# Patient Record
Sex: Female | Born: 1975 | Race: White | Hispanic: No | Marital: Married | Smoking: Never smoker
Health system: Southern US, Community
[De-identification: ages and names within clinical notes are randomized; demographics above are authoritative.]

## PROBLEM LIST (undated history)

## (undated) DIAGNOSIS — N289 Disorder of kidney and ureter, unspecified: Secondary | ICD-10-CM

## (undated) DIAGNOSIS — M549 Dorsalgia, unspecified: Secondary | ICD-10-CM

## (undated) DIAGNOSIS — M2550 Pain in unspecified joint: Secondary | ICD-10-CM

## (undated) DIAGNOSIS — R1310 Dysphagia, unspecified: Secondary | ICD-10-CM

## (undated) HISTORY — DX: Dorsalgia, unspecified: M54.9

## (undated) HISTORY — DX: Disorder of kidney and ureter, unspecified: N28.9

## (undated) HISTORY — DX: Dysphagia, unspecified: R13.10

## (undated) HISTORY — DX: Pain in unspecified joint: M25.50

---

## 2008-05-28 ENCOUNTER — Inpatient Hospital Stay (HOSPITAL_COMMUNITY): Admission: AD | Admit: 2008-05-28 | Discharge: 2008-05-28 | Payer: Self-pay | Admitting: Obstetrics and Gynecology

## 2008-07-06 ENCOUNTER — Inpatient Hospital Stay (HOSPITAL_COMMUNITY): Admission: RE | Admit: 2008-07-06 | Discharge: 2008-07-08 | Payer: Self-pay | Admitting: Obstetrics and Gynecology

## 2010-11-28 NOTE — Op Note (Signed)
[name], [name]           ACCOUNT NO.:  [id number]   MEDICAL RECORD NO.:  [id number]          PATIENT TYPE:  INP   LOCATION:  9124                          FACILITY:  [facility]   PHYSICIAN:  Maxie Better, M.D.DATE OF BIRTH:  1976-04-25   DATE OF PROCEDURE:  07/07/2008  DATE OF DISCHARGE:                               OPERATIVE REPORT   PREOPERATIVE DIAGNOSIS:  Previous cesarean section, term gestation.   PROCEDURE:  Repeat cesarean section, Kerr hysterotomy.   POSTOPERATIVE DIAGNOSIS:  Previous cesarean section, term gestation.   ANESTHESIA:  Spinal.   SURGEON:  Maxie Better, MD   ASSISTANT:  Marlinda Mike, CNM   INDICATIONS:  A 35 year old gravida 2, para 1 female at term with a  previous cesarean section scheduled for a repeat C-section.  Prenatal  course had been uncomplicated.  Consent signed.  The patient was  transferred to the operating room.   PROCEDURE:  Under adequate spinal anesthesia, the patient was placed in  a supine position with a left lateral tilt.  She was sterilely prepped  and draped in usual fashion.  An indwelling Foley catheter was sterilely  placed.  Marcaine 0.25% was injected along the previous Pfannenstiel  skin incision.  Pfannenstiel skin incision was then made, carried down  to the rectus fascia.  Rectus fascia was opened transversely.  The  rectus fascia was then bluntly and with cautery dissected off the rectus  muscle in superior and inferior fashion.  The rectus muscles were split  in midline.  The parietal peritoneum was entered sharply and extended.  Small amount of adhesions of the bladder was noted in the lower uterine  segment.  The vesicouterine peritoneum was carefully opened  transversely.  The bladder was dissected off the lower uterine segment,  some of which was adherent and therefore was not able to be fully  displaced inferiorly.  Nonetheless, thin lower uterine segment was  encountered.  Curvilinear low transverse  uterine incision was then made  and extended with bandage scissors.  Subsequent delivery of a live female  from a transverse position was then accomplished.  Baby was bulb  suctioned in the abdomen.  The cord was clamped, cut.  The baby was  transferred to the awaiting pediatrician who assigned Apgars of 9 and 9  at one and five minutes.  The placenta, which was anterior was manually  removed.  Uterine cavity was cleaned of debris.  Uterine incision had no  extension.  The first layer was closed with 0-Monocryl running locked  stitch.  Additional removal of the bladder off the lower uterine segment  was then performed in order to facilitate a second layer of closure with  0-Monocryl in an imbricated fashion.  Good hemostasis was then noted.  Normal tubes and ovaries were noted bilaterally.  The abdomen was  copiously irrigated and suctioned of debris.  The parietal peritoneum  was closed with 2-0 Vicryl.  The rectus fascia was closed with 0-Vicryl  x2.  The subcutaneous area was irrigated, small bleeders cauterized.  Interrupted 2-0 plain sutures placed and the skin approximated using  Ethicon staples.   SPECIMENS:  Placenta not sent to Pathology.   ESTIMATED BLOOD LOSS:  600 mL.   URINE OUTPUT:  150 mL of clear yellow urine.   INTRAOPERATIVE FLUID:  1250 mL.   Sponge and instrument counts x2 was correct.   COMPLICATIONS:  None.   Weight of the baby was 9 pounds 3 ounces.  The patient tolerated the  procedure well and was transferred to recovery room in stable condition.      Maxie Better, M.D.  Electronically Signed     [place]/MEDQ  D:  07/06/2008  T:  07/06/2008  Job:  [id number]

## 2010-12-01 NOTE — Discharge Summary (Signed)
[name], [name]           ACCOUNT NO.:  [id number]   MEDICAL RECORD NO.:  [id number]          PATIENT TYPE:  INP   LOCATION:  9124                          FACILITY:  [facility]   PHYSICIAN:  Maxie Better, M.D.DATE OF BIRTH:  [birth year]   DATE OF ADMISSION:  07/06/2008  DATE OF DISCHARGE:  07/08/2008                               DISCHARGE SUMMARY   ADMISSION DIAGNOSIS:  Previous cesarean section, term gestation.   DISCHARGE DIAGNOSES:  Term gestation, delivered, previous cesarean  section, upper respiratory infection.   HISTORY OF PRESENT ILLNESS:  A 35 year old gravida 2, para 1-0-0-1  female with previous cesarean section, now at term, desires repeat C-  section.   HOSPITAL COURSE:  The patient was admitted to [facility].  She was  taken to the operating room where she underwent a repeat cesarean  section procedure resulted in delivery of a live female,  9 pounds 3  ounces, normal tubes and ovaries, Apgars of 9 and 9.  The lower uterine  segment was very thin at the time of her surgery.  Her postoperative  course was complicated by complaints of green productive sputum with  cough not responsive to Tessalon or Robitussin.  She was afebrile.  Her  lungs were clear.  Her incision had no evidence of infection.  Her CBC  on postop day #1 showed a hemoglobin of 11, hematocrit of 32.3, white  count was 8.9, platelet count of 254,000.  On postop day #2, the patient  requested to go home.   DISPOSITION:  Home.   CONDITION:  Stable.   DISCHARGE MEDICATIONS:  1. Z-Pak as directed.  2. Tylox 1-2 tablets every 3-4 hours p.r.n. pain.  3. Motrin 800 mg one p.o. q.6-8 h. p.r.n. pain.  4. Tussionex 1 teaspoon p.o. b.i.d.   DISCHARGE INSTRUCTIONS:  Per the postpartum booklet given.   FOLLOWUP:  Followup appointment at [facility] in 6 weeks.      Maxie Better, M.D.  Electronically Signed     [place]/MEDQ  D:  08/01/2008  T:  08/01/2008  Job:  [id number]

## 2011-04-20 LAB — CBC
HCT: 35.7 % — ABNORMAL LOW (ref 36.0–46.0)
Hemoglobin: 11.7 g/dL — ABNORMAL LOW (ref 12.0–15.0)
MCHC: 34.2 g/dL (ref 30.0–36.0)
MCV: 82.6 fL (ref 78.0–100.0)
MCV: 82.8 fL (ref 78.0–100.0)
RBC: 3.9 MIL/uL (ref 3.87–5.11)
RDW: 14.8 % (ref 11.5–15.5)
RDW: 14.8 % (ref 11.5–15.5)

## 2011-04-20 LAB — CCBB MATERNAL DONOR DRAW

## 2018-08-28 ENCOUNTER — Encounter (INDEPENDENT_AMBULATORY_CARE_PROVIDER_SITE_OTHER): Payer: Self-pay

## 2018-09-08 ENCOUNTER — Ambulatory Visit (INDEPENDENT_AMBULATORY_CARE_PROVIDER_SITE_OTHER): Payer: BLUE CROSS/BLUE SHIELD | Admitting: Family Medicine

## 2018-09-08 ENCOUNTER — Encounter (INDEPENDENT_AMBULATORY_CARE_PROVIDER_SITE_OTHER): Payer: Self-pay | Admitting: Family Medicine

## 2018-09-08 VITALS — BP 115/80 | HR 76 | Temp 98.3°F | Ht 65.0 in | Wt 232.0 lb

## 2018-09-08 DIAGNOSIS — R5383 Other fatigue: Secondary | ICD-10-CM

## 2018-09-08 DIAGNOSIS — Z6838 Body mass index (BMI) 38.0-38.9, adult: Secondary | ICD-10-CM | POA: Diagnosis not present

## 2018-09-08 DIAGNOSIS — R0602 Shortness of breath: Secondary | ICD-10-CM

## 2018-09-08 DIAGNOSIS — Z9189 Other specified personal risk factors, not elsewhere classified: Secondary | ICD-10-CM

## 2018-09-08 DIAGNOSIS — Z0289 Encounter for other administrative examinations: Secondary | ICD-10-CM

## 2018-09-08 DIAGNOSIS — Z1331 Encounter for screening for depression: Secondary | ICD-10-CM | POA: Diagnosis not present

## 2018-09-08 DIAGNOSIS — K219 Gastro-esophageal reflux disease without esophagitis: Secondary | ICD-10-CM | POA: Diagnosis not present

## 2018-09-08 DIAGNOSIS — E559 Vitamin D deficiency, unspecified: Secondary | ICD-10-CM

## 2018-09-08 NOTE — Progress Notes (Signed)
Office: [phone]  /  Fax: [phone]   Dear Dr. Cherly Hensen,   Thank you for referring CADEN FATICA to our clinic. The following note includes my evaluation and treatment recommendations.  HPI:   Chief Complaint: OBESITY    [name] has been referred by Maxie Better, MD for consultation regarding her obesity and obesity related comorbidities.    [name] (MR# [id number]) is a 43 y.o. female who presents on 09/08/2018 for obesity evaluation and treatment. Current BMI is Body mass index is 38.61 kg/m.[name] [name] has been struggling with her weight for many years and has been unsuccessful in either losing weight, maintaining weight loss, or reaching her healthy weight goal.     [name] attended our information session and states she is currently in the action stage of change and ready to dedicate time achieving and maintaining a healthier weight. [name] is interested in becoming our patient and working on intensive lifestyle modifications including (but not limited to) diet, exercise and weight loss.    [name] states her family eats meals together she thinks her family will eat healthier with  her her desired weight loss is 62 lbs she has been heavy most of  her life she started gaining weight in 2004 her heaviest weight ever was 232 lbs. she is a picky eater and doesn't like to eat healthier foods  she snacks frequently in the evenings she is frequently drinking liquids with calories (Coke Zero 3 12-oz. cans a day) she frequently makes poor food choices she frequently eats larger portions than normal  she struggles with emotional eating    Fatigue [name] feels her energy is lower than it should be. This has worsened with weight gain and has worsened recently. [name] admits to daytime somnolence and  admits to waking up still tired. Patient is at risk for obstructive sleep apnea. Patent has a history of symptoms of daytime fatigue, morning fatigue  and morning headache. Patient generally gets 6 hours of sleep per night, and states they generally have generally restful sleep. Snoring is present. Apneic episodes are not present. Epworth Sleepiness Score is 14.  Dyspnea on exertion [name] notes increasing shortness of breath with exercising and seems to be worsening over time with weight gain. She notes getting out of breath sooner with activity than she used to. This has gotten worse recently. [name] denies orthopnea.   Gastroesophageal Reflux Disease (GERD) [name] states that her GERD symptoms do not occur on a daily basis.   Vitamin D deficiency [name] has a diagnosis of Vitamin D deficiency likely given her obesity.  At risk for osteopenia and osteoporosis [name] is at higher risk of osteopenia and osteoporosis due to Vitamin D deficiency.   Depression Screen [name]'s Food and Mood (modified PHQ-9) score was 10.  Depression screen [facility] 2/9 09/08/2018  Decreased Interest 2  Down, Depressed, Hopeless 1  PHQ - 2 Score 3  Altered sleeping 0  Tired, decreased energy 3  Change in appetite 2  Feeling bad or failure about yourself  1  Trouble concentrating 1  Moving slowly or fidgety/restless 0  Suicidal thoughts 0  PHQ-9 Score 10  Difficult doing work/chores Not difficult at all   ASSESSMENT AND PLAN:  Other fatigue - Plan: EKG 12-Lead, CBC With Differential, Comprehensive metabolic panel, Hemoglobin A1c, Insulin, random, T3, T4, free, TSH, Vitamin B12, Folate  Shortness of breath on exertion - Plan: Lipid Panel With LDL/HDL Ratio  Gastroesophageal reflux disease, esophagitis presence not specified  Vitamin  D deficiency - Plan: VITAMIN D 25 Hydroxy (Vit-D Deficiency, Fractures)  Depression screening  At risk for osteoporosis  Class 2 severe obesity with serious comorbidity and body mass index (BMI) of 38.0 to 38.9 in adult, unspecified obesity type (HCC)  PLAN:  Fatigue [name] was informed that her fatigue may  be related to obesity, depression or many other causes. Labs will be ordered, and in the meanwhile [name] has agreed to work on diet, exercise and weight loss to help with fatigue. Proper sleep hygiene was discussed including the need for 7-8 hours of quality sleep each night. A sleep study was not ordered based on symptoms and Epworth score. Will obtain EKG, IC, and labs on today's visit.  Dyspnea on exertion [name]'s shortness of breath appears to be obesity related and exercise induced. She has agreed to work on weight loss and gradually increase exercise to treat her exercise induced shortness of breath. If [name] follows our instructions and loses weight without improvement of her shortness of breath, we will plan to refer to pulmonology. We will monitor this condition regularly. [name] agrees to this plan.  Gastroesophageal Reflux Disease (GERD) We will follow-up at her next appointment in 2 weeks.  Vitamin D Deficiency [name] was informed that low Vitamin D levels contributes to fatigue and are associated with obesity, breast, and colon cancer. We will obtain Vitamin D level today. [name] agrees to follow-up with our clinic in 2 weeks.  At risk for osteopenia and osteoporosis [name] was given extended  (15 minutes) osteoporosis prevention counseling today. [name] is at risk for osteopenia and osteoporsis due to her Vvitamin D deficiency. She was encouraged to take her Vitamin D and follow her higher calcium diet and increase strengthening exercise to help strengthen her bones and decrease her risk of osteopenia and osteoporosis.  Depression Screen [name] had a moderately positive depression screening. Depression is commonly associated with obesity and often results in emotional eating behaviors. We will monitor this closely and work on CBT to help improve the non-hunger eating patterns. Referral to Psychology may be required if no improvement is seen as she continues in our  clinic.  Obesity [name] is currently in the action stage of change and her goal is to continue with weight loss efforts. I recommend Lilyrose begin the structured treatment plan as follows:  She has agreed to follow the Category 3 plan. She was advised to limit sweet tea to 2 days a week and may substitute english muffin for bread at breakfast. Shonya has been instructed to eventually work up to a goal of 150 minutes of combined cardio and strengthening exercise per week for weight loss and overall health benefits. We discussed the following Behavioral Modification Strategies today: increasing lean protein intake, increasing vegetables, work on meal planning and easy cooking plan, and planning for success.   She was informed of the importance of frequent follow up visits to maximize her success with intensive lifestyle modifications for her multiple health conditions. She was informed we would discuss her lab results at her next visit unless there is a critical issue that needs to be addressed sooner. Bari agreed to keep her next visit at the agreed upon time to discuss these results.  ALLERGIES: Allergies  Allergen Reactions  . Sulfa Antibiotics Rash    MEDICATIONS: Current Outpatient Medications on File Prior to Visit  Medication Sig Dispense Refill  . Cetirizine HCl (ZYRTEC ALLERGY) 10 MG CAPS Take 1 capsule by mouth daily.    . Cranberry  1000 MG CAPS Take 1 capsule by mouth daily.    [name] ibuprofen (ADVIL,MOTRIN) 100 MG tablet Take 100 mg by mouth every 6 (six) hours as needed for fever.    [name] Oil 1000 MG CAPS Take 1 capsule by mouth daily.    [name] levonorgestrel-ethinyl estradiol (AVIANE) 0.1-20 MG-MCG tablet Take 1 tablet by mouth daily.    . Probiotic Product (PROBIOTIC-10 PO) Take 1 capsule by mouth daily.    . pseudoephedrine (SUDAFED) 30 MG tablet Take 30 mg by mouth every 4 (four) hours as needed for congestion.     No current facility-administered medications on file prior  to visit.     PAST MEDICAL HISTORY: Past Medical History:  Diagnosis Date  . Back pain   . Joint pain   . Kidney problem   . Swallowing difficulty     PAST SURGICAL HISTORY: Past Surgical History:  Procedure Laterality Date  . CESAREAN SECTION     x2    SOCIAL HISTORY: Social History   Tobacco Use  . Smoking status: Never Smoker  . Smokeless tobacco: Never Used  Substance Use Topics  . Alcohol use: Not Currently  . Drug use: Never   FAMILY HISTORY: History reviewed. No pertinent family history.  ROS: Review of Systems  Constitutional: Positive for malaise/fatigue.  HENT: Positive for sinus pain.        Positive for difficult or painful swallowing.  Eyes:       Positive for floaters.  Cardiovascular: Negative for orthopnea.  Gastrointestinal: Positive for heartburn.       Positive for swallowing difficulty.  Musculoskeletal: Positive for back pain, joint pain and myalgias.  Neurological:       Positive for leg cramping.  Endo/Heme/Allergies: Bruises/bleeds easily.   PHYSICAL EXAM: Blood pressure 115/80, pulse 76, temperature 98.3 F (36.8 C), temperature source Oral, height 5\' 5"  (1.651 m), weight 232 lb (105.2 kg), SpO2 97 %. Body mass index is 38.61 kg/m. Physical Exam Vitals signs reviewed.  Constitutional:      Appearance: Normal appearance. She is well-developed. She is obese.  HENT:     Head: Normocephalic and atraumatic.     Nose: Nose normal.  Eyes:     General: No scleral icterus. Neck:     Musculoskeletal: Normal range of motion.  Cardiovascular:     Rate and Rhythm: Normal rate and regular rhythm.  Pulmonary:     Effort: Pulmonary effort is normal. No respiratory distress.  Abdominal:     Palpations: Abdomen is soft.     Tenderness: There is no abdominal tenderness.  Musculoskeletal: Normal range of motion.     Comments: Range of motion normal in all four extremities.  Skin:    General: Skin is warm and dry.  Neurological:      Mental Status: She is alert and oriented to person, place, and time.     Coordination: Coordination normal.  Psychiatric:        Mood and Affect: Mood and affect normal.        Behavior: Behavior normal.   RECENT LABS AND TESTS: BMET No results found for: NA, K, CL, CO2, GLUCOSE, BUN, CREATININE, CALCIUM, GFRNONAA, GFRAA No results found for: HGBA1C No results found for: INSULIN CBC    Component Value Date/Time   WBC 8.9 07/07/2008 0505   RBC 3.90 07/07/2008 0505   HGB 11.0 (L) 07/07/2008 0505   HCT 32.3 (L) 07/07/2008 0505   PLT 254 07/07/2008 0505   MCV 82.8 07/07/2008  0505   MCHC 34.2 07/07/2008 0505   RDW 14.8 07/07/2008 0505   Iron/TIBC/Ferritin/ %Sat No results found for: IRON, TIBC, FERRITIN, IRONPCTSAT Lipid Panel  No results found for: CHOL, TRIG, HDL, CHOLHDL, VLDL, LDLCALC, LDLDIRECT Hepatic Function Panel  No results found for: PROT, ALBUMIN, AST, ALT, ALKPHOS, BILITOT, BILIDIR, IBILI No results found for: TSH  ECG  shows low voltage in precordial leads - decreasing R-wave progression, may be secondary to pulmonary disease. Consider old anterior infarct. Rate of 82 BPM.  INDIRECT CALORIMETER done today shows a VO2 of 300 and a REE of 2085.  Her calculated basal metabolic rate is 9798 thus her basal metabolic rate is better than expected.  OBESITY BEHAVIORAL INTERVENTION VISIT  Today's visit was #1   Starting weight: 232 lbs Starting date: 09/08/2018 Today's weight: 232 lbs Today's date: 09/08/2018 Total lbs lost to date: 0   09/08/2018  Height 5\' 5"  (1.651 m)  Weight 232 lb (105.2 kg)  BMI (Calculated) 38.61  BLOOD PRESSURE - SYSTOLIC 115  BLOOD PRESSURE - DIASTOLIC 80  Waist Measurement  46 inches   Body Fat % 44.8 %  Total Body Water (lbs) 86.6 lbs  RMR 2085   ASK: We discussed the diagnosis of obesity with [name] today and Ogechi agreed to give [place] permission to discuss obesity behavioral modification therapy  today.  ASSESS: Zenae has the diagnosis of obesity and her BMI today is 38.61. Jaryah is in the action stage of change.   ADVISE: Xochilt was educated on the multiple health risks of obesity as well as the benefit of weight loss to improve her health. She was advised of the need for long term treatment and the importance of lifestyle modifications to improve her current health and to decrease her risk of future health problems.  AGREE: Multiple dietary modification options and treatment options were discussed and  Kessa agreed to follow the recommendations documented in the above note.  ARRANGE: Soniah was educated on the importance of frequent visits to treat obesity as outlined per CMS and USPSTF guidelines and agreed to schedule her next follow up appointment today.  I, Marianna Payment, am acting as transcriptionist for Debbra Riding, MD   I have reviewed the above documentation for accuracy and completeness, and I agree with the above. - Debbra Riding, MD

## 2018-09-09 LAB — COMPREHENSIVE METABOLIC PANEL
A/G RATIO: 1.4 (ref 1.2–2.2)
ALT: 16 IU/L (ref 0–32)
AST: 14 IU/L (ref 0–40)
Albumin: 4.2 g/dL (ref 3.8–4.8)
Alkaline Phosphatase: 90 IU/L (ref 39–117)
BUN/Creatinine Ratio: 14 (ref 9–23)
BUN: 10 mg/dL (ref 6–24)
Bilirubin Total: <0.2 mg/dL (ref 0.0–1.2)
CALCIUM: 9.2 mg/dL (ref 8.7–10.2)
CO2: 22 mmol/L (ref 20–29)
CREATININE: 0.69 mg/dL (ref 0.57–1.00)
Chloride: 106 mmol/L (ref 96–106)
GFR, EST AFRICAN AMERICAN: 125 mL/min/{1.73_m2} (ref >59)
GFR, EST NON AFRICAN AMERICAN: 108 mL/min/{1.73_m2} (ref >59)
GLUCOSE: 86 mg/dL (ref 65–99)
Globulin, Total: 3 g/dL (ref 1.5–4.5)
Potassium: 4.8 mmol/L (ref 3.5–5.2)
Sodium: 146 mmol/L — ABNORMAL HIGH (ref 134–144)
TOTAL PROTEIN: 7.2 g/dL (ref 6.0–8.5)

## 2018-09-09 LAB — LIPID PANEL WITH LDL/HDL RATIO
CHOLESTEROL TOTAL: 223 mg/dL — AB (ref 100–199)
HDL: 77 mg/dL (ref >39)
LDL Calculated: 130 mg/dL — ABNORMAL HIGH (ref 0–99)
LDl/HDL Ratio: 1.7 ratio (ref 0.0–3.2)
TRIGLYCERIDES: 82 mg/dL (ref 0–149)
VLDL Cholesterol Cal: 16 mg/dL (ref 5–40)

## 2018-09-09 LAB — HEMOGLOBIN A1C
Est. average glucose Bld gHb Est-mCnc: 105 mg/dL
Hgb A1c MFr Bld: 5.3 % (ref 4.8–5.6)

## 2018-09-09 LAB — FOLATE: FOLATE: 3 ng/mL — AB (ref >3.0)

## 2018-09-09 LAB — CBC WITH DIFFERENTIAL
Basophils Absolute: 0 10*3/uL (ref 0.0–0.2)
Basos: 0 %
EOS (ABSOLUTE): 0.1 10*3/uL (ref 0.0–0.4)
Eos: 1 %
Hematocrit: 40.6 % (ref 34.0–46.6)
Hemoglobin: 13.4 g/dL (ref 11.1–15.9)
IMMATURE GRANS (ABS): 0 10*3/uL (ref 0.0–0.1)
Immature Granulocytes: 0 %
LYMPHS: 20 %
Lymphocytes Absolute: 2 10*3/uL (ref 0.7–3.1)
MCH: 28.5 pg (ref 26.6–33.0)
MCHC: 33 g/dL (ref 31.5–35.7)
MCV: 86 fL (ref 79–97)
Monocytes Absolute: 0.6 10*3/uL (ref 0.1–0.9)
Monocytes: 6 %
NEUTROS PCT: 73 %
Neutrophils Absolute: 7.3 10*3/uL — ABNORMAL HIGH (ref 1.4–7.0)
RBC: 4.71 x10E6/uL (ref 3.77–5.28)
RDW: 12.1 % (ref 11.7–15.4)
WBC: 10 10*3/uL (ref 3.4–10.8)

## 2018-09-09 LAB — INSULIN, RANDOM: INSULIN: 18.7 u[IU]/mL (ref 2.6–24.9)

## 2018-09-09 LAB — T4, FREE: Free T4: 0.99 ng/dL (ref 0.82–1.77)

## 2018-09-09 LAB — TSH: TSH: 0.64 u[IU]/mL (ref 0.450–4.500)

## 2018-09-09 LAB — T3: T3, Total: 161 ng/dL (ref 71–180)

## 2018-09-09 LAB — VITAMIN B12: VITAMIN B 12: 229 pg/mL — AB (ref 232–1245)

## 2018-09-09 LAB — VITAMIN D 25 HYDROXY (VIT D DEFICIENCY, FRACTURES): Vit D, 25-Hydroxy: 35.9 ng/mL (ref 30.0–100.0)

## 2018-09-22 ENCOUNTER — Encounter (INDEPENDENT_AMBULATORY_CARE_PROVIDER_SITE_OTHER): Payer: Self-pay | Admitting: Family Medicine

## 2018-09-22 ENCOUNTER — Ambulatory Visit (INDEPENDENT_AMBULATORY_CARE_PROVIDER_SITE_OTHER): Payer: BLUE CROSS/BLUE SHIELD | Admitting: Family Medicine

## 2018-09-22 VITALS — BP 117/78 | HR 86 | Temp 97.9°F | Ht 65.0 in | Wt 224.0 lb

## 2018-09-22 DIAGNOSIS — E8881 Metabolic syndrome: Secondary | ICD-10-CM | POA: Diagnosis not present

## 2018-09-22 DIAGNOSIS — Z9189 Other specified personal risk factors, not elsewhere classified: Secondary | ICD-10-CM

## 2018-09-22 DIAGNOSIS — Z6837 Body mass index (BMI) 37.0-37.9, adult: Secondary | ICD-10-CM

## 2018-09-22 DIAGNOSIS — E538 Deficiency of other specified B group vitamins: Secondary | ICD-10-CM

## 2018-09-22 DIAGNOSIS — E559 Vitamin D deficiency, unspecified: Secondary | ICD-10-CM

## 2018-09-22 MED ORDER — VITAMIN D (ERGOCALCIFEROL) 1.25 MG (50000 UNIT) PO CAPS: 50000.0000 [IU] | ORAL_CAPSULE | ORAL | 0 refills | Status: DC

## 2018-09-22 NOTE — Progress Notes (Signed)
Office: [phone]  /  Fax: [phone]   HPI:   Chief Complaint: OBESITY [name] is here to discuss her progress with her obesity treatment plan. She is on the Category 3 plan and is following her eating plan approximately 80 % of the time. She states she is exercising 0 minutes 0 times per week. [name] found following the meal plan to be relatively easy. She found dinner to be challenging secondary to effort of making food. She notes minimal hunger during the 1st week. She had snacks of rice cakes mini, Kind bars, and Yasso bars.  Her weight is 224 lb (101.6 kg) today and has had a weight loss of 8 pounds over a period of 2 weeks since her last visit. She has lost 8 lbs since starting treatment with [place].  Vitamin D Deficiency [name] has a diagnosis of vitamin D deficiency. She is not currently taking OTC Vit D. She notes fatigue and denies nausea, vomiting or muscle weakness.  At risk for osteopenia and osteoporosis [name] is at higher risk of osteopenia and osteoporosis due to vitamin D deficiency.   Vitamin B12 Deficiency [name] has a diagnosis of B12 insufficiency and notes fatigue. She is not on OTC supplementation. Her Vitamin B12 level is 229. She is not a vegetarian and does not have a previous diagnosis of pernicious anemia. She does not have a history of weight loss surgery.   Folate Deficiency [name] has a diagnosis of folate deficiency. Her folate level is of 3.0.  Insulin Resistance [name] has a diagnosis of insulin resistance based on her elevated fasting insulin level >5. Insulin of 18.7 and Hgb A1c of 5.3. Although [name]'s blood glucose readings are still under good control, insulin resistance puts her at greater risk of metabolic syndrome and diabetes. She is not taking metformin currently and notes occasional cravings for sweets. She continues to work on diet and exercise to decrease risk of diabetes.  ASSESSMENT AND PLAN:  Vitamin D deficiency - Plan:  Vitamin D, Ergocalciferol, (DRISDOL) 1.25 MG (50000 UT) CAPS capsule  B12 nutritional deficiency  Folate deficiency  Insulin resistance  At risk for osteoporosis  Class 2 severe obesity with serious comorbidity and body mass index (BMI) of 37.0 to 37.9 in adult, unspecified obesity type (HCC)  PLAN:  Vitamin D Deficiency [name] was informed that low vitamin D levels contributes to fatigue and are associated with obesity, breast, and colon cancer. [name] agrees to start prescription Vit D @50 ,000 IU every week #4 with no refills. She will follow up for routine testing of vitamin D, at least 2-3 times per year. She was informed of the risk of over-replacement of vitamin D and agrees to not increase her dose unless she discusses this with [place] first. [name] agrees to follow up with our clinic in 2 weeks.  At risk for osteopenia and osteoporosis [name] was given extended (30 minutes) osteoporosis prevention counseling today. [name] is at risk for osteopenia and osteoporsis due to her vitamin D deficiency. She was encouraged to take her vitamin D and follow her higher calcium diet and increase strengthening exercise to help strengthen her bones and decrease her risk of osteopenia and osteoporosis.  Vitamin B12 Deficiency [name] will work on increasing B12 rich foods in her diet. B12 supplementation was not prescribed today. We will repeat Vit B12 level in 3 months. She was encouraged to start daily multivitamins. [name] agrees to follow up with our clinic in 2 weeks.  Folate Deficiency We will repeat folate  in 3 months. She was encouraged to take multivitamins. [name] agrees to follow up with our clinic in 2 weeks.  Insulin Resistance [name] will continue to work on weight loss, exercise, and decreasing simple carbohydrates in her diet to help decrease the risk of diabetes. We dicussed metformin including benefits and risks. She was informed that eating too many simple carbohydrates or  too many calories at one sitting increases the likelihood of GI side effects. [name] declined metformin for now and prescription was not written today. We will repeat insulin and Hgb A1c in 3 months. [name] agrees to follow up with our clinic in 2 weeks as directed to monitor her progress.  Obesity [name] is currently in the action stage of change. As such, her goal is to continue with weight loss efforts She has agreed to follow the Category 3 plan [name] has been instructed to work up to a goal of 150 minutes of combined cardio and strengthening exercise per week for weight loss and overall health benefits. We discussed the following Behavioral Modification Strategies today: increasing lean protein intake, increasing vegetables, work on meal planning and easy cooking plans, better snacking choices, and planning for success   [name] has agreed to follow up with our clinic in 2 weeks. She was informed of the importance of frequent follow up visits to maximize her success with intensive lifestyle modifications for her multiple health conditions.  ALLERGIES: Allergies  Allergen Reactions  . Sulfa Antibiotics Rash    MEDICATIONS: Current Outpatient Medications on File Prior to Visit  Medication Sig Dispense Refill  . Cetirizine HCl (ZYRTEC ALLERGY) 10 MG CAPS Take 1 capsule by mouth daily.    . Cranberry 1000 MG CAPS Take 1 capsule by mouth daily.    [name] ibuprofen (ADVIL,MOTRIN) 100 MG tablet Take 100 mg by mouth every 6 (six) hours as needed for fever.    [name] Oil 1000 MG CAPS Take 1 capsule by mouth daily.    [name] levonorgestrel-ethinyl estradiol (AVIANE) 0.1-20 MG-MCG tablet Take 1 tablet by mouth daily.    . Probiotic Product (PROBIOTIC-10 PO) Take 1 capsule by mouth daily.    . pseudoephedrine (SUDAFED) 30 MG tablet Take 30 mg by mouth every 4 (four) hours as needed for congestion.     No current facility-administered medications on file prior to visit.     PAST MEDICAL  HISTORY: Past Medical History:  Diagnosis Date  . Back pain   . Joint pain   . Kidney problem   . Swallowing difficulty     PAST SURGICAL HISTORY: Past Surgical History:  Procedure Laterality Date  . CESAREAN SECTION     x2    SOCIAL HISTORY: Social History   Tobacco Use  . Smoking status: Never Smoker  . Smokeless tobacco: Never Used  Substance Use Topics  . Alcohol use: Not Currently  . Drug use: Never    FAMILY HISTORY: History reviewed. No pertinent family history.  ROS: Review of Systems  Constitutional: Positive for malaise/fatigue and weight loss.  Gastrointestinal: Negative for nausea and vomiting.  Musculoskeletal:       Negative muscle weakness    PHYSICAL EXAM: Blood pressure 117/78, pulse 86, temperature 97.9 F (36.6 C), temperature source Oral, height 5\' 5"  (1.651 m), weight 224 lb (101.6 kg), SpO2 99 %. Body mass index is 37.28 kg/m. Physical Exam Vitals signs reviewed.  Constitutional:      Appearance: Normal appearance. She is obese.  Cardiovascular:     Rate and Rhythm:  Normal rate.     Pulses: Normal pulses.  Pulmonary:     Effort: Pulmonary effort is normal.     Breath sounds: Normal breath sounds.  Musculoskeletal: Normal range of motion.  Skin:    General: Skin is warm and dry.  Neurological:     Mental Status: She is alert and oriented to person, place, and time.  Psychiatric:        Mood and Affect: Mood normal.        Behavior: Behavior normal.     RECENT LABS AND TESTS: BMET    Component Value Date/Time   NA 146 (H) 09/08/2018 1207   K 4.8 09/08/2018 1207   CL 106 09/08/2018 1207   CO2 22 09/08/2018 1207   GLUCOSE 86 09/08/2018 1207   BUN 10 09/08/2018 1207   CREATININE 0.69 09/08/2018 1207   CALCIUM 9.2 09/08/2018 1207   GFRNONAA 108 09/08/2018 1207   GFRAA 125 09/08/2018 1207   Lab Results  Component Value Date   HGBA1C 5.3 09/08/2018   Lab Results  Component Value Date   INSULIN 18.7 09/08/2018   CBC     Component Value Date/Time   WBC 10.0 09/08/2018 1207   WBC 8.9 07/07/2008 0505   RBC 4.71 09/08/2018 1207   RBC 3.90 07/07/2008 0505   HGB 13.4 09/08/2018 1207   HCT 40.6 09/08/2018 1207   PLT 254 07/07/2008 0505   MCV 86 09/08/2018 1207   MCH 28.5 09/08/2018 1207   MCHC 33.0 09/08/2018 1207   MCHC 34.2 07/07/2008 0505   RDW 12.1 09/08/2018 1207   LYMPHSABS 2.0 09/08/2018 1207   EOSABS 0.1 09/08/2018 1207   BASOSABS 0.0 09/08/2018 1207   Iron/TIBC/Ferritin/ %Sat No results found for: IRON, TIBC, FERRITIN, IRONPCTSAT Lipid Panel     Component Value Date/Time   CHOL 223 (H) 09/08/2018 1207   TRIG 82 09/08/2018 1207   HDL 77 09/08/2018 1207   LDLCALC 130 (H) 09/08/2018 1207   Hepatic Function Panel     Component Value Date/Time   PROT 7.2 09/08/2018 1207   ALBUMIN 4.2 09/08/2018 1207   AST 14 09/08/2018 1207   ALT 16 09/08/2018 1207   ALKPHOS 90 09/08/2018 1207   BILITOT <0.2 09/08/2018 1207      Component Value Date/Time   TSH 0.640 09/08/2018 1207      OBESITY BEHAVIORAL INTERVENTION VISIT  Today's visit was # 2   Starting weight: 232 lbs Starting date: 09/08/2018 Today's weight : 224 lbs  Today's date: 09/22/2018 Total lbs lost to date: 8    09/22/2018  Height  (1.651 m)  Weight 224 lb (101.6 kg)  BMI (Calculated) 37.28  BLOOD PRESSURE - SYSTOLIC 117  BLOOD PRESSURE - DIASTOLIC 78   Body Fat % 43.5 %  Total Body Water (lbs) 84.2 lbs     ASK: We discussed the diagnosis of obesity with [name] today and [name] agreed to give [place] permission to discuss obesity behavioral modification therapy today.  ASSESS: Kinnley has the diagnosis of obesity and her BMI today is 37.28 [name] is in the action stage of change   ADVISE: [name] was educated on the multiple health risks of obesity as well as the benefit of weight loss to improve her health. She was advised of the need for long term treatment and the importance of lifestyle  modifications to improve her current health and to decrease her risk of future health problems.  AGREE: Multiple dietary modification options and treatment  options were discussed and  [name] agreed to follow the recommendations documented in the above note.  ARRANGE: [name] was educated on the importance of frequent visits to treat obesity as outlined per CMS and USPSTF guidelines and agreed to schedule her next follow up appointment today.  I, Burt Knack, am acting as transcriptionist for Debbra Riding, MD  I have reviewed the above documentation for accuracy and completeness, and I agree with the above. - Debbra Riding, MD

## 2018-10-07 ENCOUNTER — Encounter (INDEPENDENT_AMBULATORY_CARE_PROVIDER_SITE_OTHER): Payer: Self-pay

## 2018-10-09 ENCOUNTER — Encounter (INDEPENDENT_AMBULATORY_CARE_PROVIDER_SITE_OTHER): Payer: Self-pay

## 2018-10-09 ENCOUNTER — Encounter (INDEPENDENT_AMBULATORY_CARE_PROVIDER_SITE_OTHER): Payer: Self-pay | Admitting: Family Medicine

## 2018-10-13 ENCOUNTER — Encounter (INDEPENDENT_AMBULATORY_CARE_PROVIDER_SITE_OTHER): Payer: Self-pay | Admitting: Family Medicine

## 2018-10-13 ENCOUNTER — Other Ambulatory Visit: Payer: Self-pay

## 2018-10-13 ENCOUNTER — Ambulatory Visit (INDEPENDENT_AMBULATORY_CARE_PROVIDER_SITE_OTHER): Payer: BLUE CROSS/BLUE SHIELD | Admitting: Family Medicine

## 2018-10-13 DIAGNOSIS — Z6837 Body mass index (BMI) 37.0-37.9, adult: Secondary | ICD-10-CM | POA: Diagnosis not present

## 2018-10-13 DIAGNOSIS — J309 Allergic rhinitis, unspecified: Secondary | ICD-10-CM

## 2018-10-13 DIAGNOSIS — E559 Vitamin D deficiency, unspecified: Secondary | ICD-10-CM | POA: Diagnosis not present

## 2018-10-13 NOTE — Progress Notes (Signed)
Office: [phone]  /  Fax: [phone] TeleHealth Visit:  [name] has consented to this TeleHealth visit today via telephone call on facetime. The patient is located at home, the provider is located at the [facility] office. The participants in this visit include the listed provider and patient and provider's assistant.   HPI:   Chief Complaint: OBESITY [name] is here to discuss her progress with her obesity treatment plan. She is on the Category 3 plan and is following her eating plan approximately 75 % of the time. She states she is exercising 0 minutes 0 times per week. [name] has found the structure of the meal plan to be helpful in home schooling. [place] the [place] and indulged in pound cake. She found following the plan to be relatively easy. She denies hunger. She is using Yasso bars for snacks.  We were unable to weight the patient today for this TeleHealth visit. She feels as if she has lost 7 lbs since her last visit. She has lost 8-15 lbs since starting treatment with [place].  Vitamin D Deficiency [name] has a diagnosis of vitamin D deficiency. She is currently taking prescription Vit D. She notes fatigue and denies nausea, vomiting or muscle weakness.  Allergic Rhinitis [name] notes her symptoms are controlled. No request to change medication.  ASSESSMENT AND PLAN:  Vitamin D deficiency - Plan: Vitamin D, Ergocalciferol, (DRISDOL) 1.25 MG (50000 UT) CAPS capsule  Allergic rhinitis, unspecified seasonality, unspecified trigger  Class 2 severe obesity with serious comorbidity and body mass index (BMI) of 37.0 to 37.9 in adult, unspecified obesity type (HCC)  PLAN:  Vitamin D Deficiency [name] was informed that low vitamin D levels contributes to fatigue and are associated with obesity, breast, and colon cancer. [name] agrees to continue taking prescription Vit D @50 ,000 IU every week #4 and we will refill for 1 month. She will  follow up for routine testing of vitamin D, at least 2-3 times per year. She was informed of the risk of over-replacement of vitamin D and agrees to not increase her dose unless she discusses this with [place] first. [name] agrees to follow up with our clinic in 2 weeks.  Allergic Rhinitis [name] agrees to continue taking Zyrtec and she agrees to follow up with our clinic in 2 weeks.  Obesity [name] is currently in the action stage of change. As such, her goal is to continue with weight loss efforts She has agreed to follow the Category 3 plan [name] has been instructed to work up to a goal of 150 minutes of combined cardio and strengthening exercise per week or she can start physical activity for 15-30 minutes 2-3 times per week for weight loss and overall health benefits. We discussed the following Behavioral Modification Strategies today: increasing lean protein intake, increasing vegetables and work on meal planning and easy cooking plans, and planning for success   [name] has agreed to follow up with our clinic in 2 weeks. She was informed of the importance of frequent follow up visits to maximize her success with intensive lifestyle modifications for her multiple health conditions.  ALLERGIES: Allergies  Allergen Reactions  . Sulfa Antibiotics Rash    MEDICATIONS: Current Outpatient Medications on File Prior to Visit  Medication Sig Dispense Refill  . Cetirizine HCl (ZYRTEC ALLERGY) 10 MG CAPS Take 1 capsule by mouth daily.    . Cranberry 1000 MG CAPS Take 1 capsule by mouth daily.    [name] ibuprofen (  ADVIL,MOTRIN) 100 MG tablet Take 100 mg by mouth every 6 (six) hours as needed for fever.    [name] Oil 1000 MG CAPS Take 1 capsule by mouth daily.    [name] levonorgestrel-ethinyl estradiol (AVIANE) 0.1-20 MG-MCG tablet Take 1 tablet by mouth daily.    . Probiotic Product (PROBIOTIC-10 PO) Take 1 capsule by mouth daily.    . pseudoephedrine (SUDAFED) 30 MG tablet Take 30 mg by mouth every 4  (four) hours as needed for congestion.    . Vitamin D, Ergocalciferol, (DRISDOL) 1.25 MG (50000 UT) CAPS capsule Take 1 capsule (50,000 Units total) by mouth every 7 (seven) days. 4 capsule 0   No current facility-administered medications on file prior to visit.     PAST MEDICAL HISTORY: Past Medical History:  Diagnosis Date  . Back pain   . Joint pain   . Kidney problem   . Swallowing difficulty     PAST SURGICAL HISTORY: Past Surgical History:  Procedure Laterality Date  . CESAREAN SECTION     x2    SOCIAL HISTORY: Social History   Tobacco Use  . Smoking status: Never Smoker  . Smokeless tobacco: Never Used  Substance Use Topics  . Alcohol use: Not Currently  . Drug use: Never    FAMILY HISTORY: History reviewed. No pertinent family history.  ROS: Review of Systems  Constitutional: Positive for malaise/fatigue and weight loss.  Gastrointestinal: Negative for nausea and vomiting.  Musculoskeletal:       Negative muscle weakness    PHYSICAL EXAM: Pt in no acute distress  RECENT LABS AND TESTS: BMET    Component Value Date/Time   NA 146 (H) 09/08/2018 1207   K 4.8 09/08/2018 1207   CL 106 09/08/2018 1207   CO2 22 09/08/2018 1207   GLUCOSE 86 09/08/2018 1207   BUN 10 09/08/2018 1207   CREATININE 0.69 09/08/2018 1207   CALCIUM 9.2 09/08/2018 1207   GFRNONAA 108 09/08/2018 1207   GFRAA 125 09/08/2018 1207   Lab Results  Component Value Date   HGBA1C 5.3 09/08/2018   Lab Results  Component Value Date   INSULIN 18.7 09/08/2018   CBC    Component Value Date/Time   WBC 10.0 09/08/2018 1207   WBC 8.9 07/07/2008 0505   RBC 4.71 09/08/2018 1207   RBC 3.90 07/07/2008 0505   HGB 13.4 09/08/2018 1207   HCT 40.6 09/08/2018 1207   PLT 254 07/07/2008 0505   MCV 86 09/08/2018 1207   MCH 28.5 09/08/2018 1207   MCHC 33.0 09/08/2018 1207   MCHC 34.2 07/07/2008 0505   RDW 12.1 09/08/2018 1207   LYMPHSABS 2.0 09/08/2018 1207   EOSABS 0.1 09/08/2018 1207    BASOSABS 0.0 09/08/2018 1207   Iron/TIBC/Ferritin/ %Sat No results found for: IRON, TIBC, FERRITIN, IRONPCTSAT Lipid Panel     Component Value Date/Time   CHOL 223 (H) 09/08/2018 1207   TRIG 82 09/08/2018 1207   HDL 77 09/08/2018 1207   LDLCALC 130 (H) 09/08/2018 1207   Hepatic Function Panel     Component Value Date/Time   PROT 7.2 09/08/2018 1207   ALBUMIN 4.2 09/08/2018 1207   AST 14 09/08/2018 1207   ALT 16 09/08/2018 1207   ALKPHOS 90 09/08/2018 1207   BILITOT <0.2 09/08/2018 1207      Component Value Date/Time   TSH 0.640 09/08/2018 1207      I, Burt Knack, am acting as transcriptionist for Debbra Riding, MD  I have reviewed the above  documentation for accuracy and completeness, and I agree with the above. - Debbra Riding, MD

## 2018-10-15 MED ORDER — VITAMIN D (ERGOCALCIFEROL) 1.25 MG (50000 UNIT) PO CAPS: 50000.0000 [IU] | ORAL_CAPSULE | ORAL | 0 refills | Status: DC

## 2018-10-28 ENCOUNTER — Ambulatory Visit (INDEPENDENT_AMBULATORY_CARE_PROVIDER_SITE_OTHER): Payer: BLUE CROSS/BLUE SHIELD | Admitting: Family Medicine

## 2018-10-28 ENCOUNTER — Encounter (INDEPENDENT_AMBULATORY_CARE_PROVIDER_SITE_OTHER): Payer: Self-pay | Admitting: Family Medicine

## 2018-10-28 ENCOUNTER — Other Ambulatory Visit: Payer: Self-pay

## 2018-10-28 DIAGNOSIS — E559 Vitamin D deficiency, unspecified: Secondary | ICD-10-CM

## 2018-10-28 DIAGNOSIS — Z6837 Body mass index (BMI) 37.0-37.9, adult: Secondary | ICD-10-CM

## 2018-10-28 DIAGNOSIS — E8881 Metabolic syndrome: Secondary | ICD-10-CM

## 2018-11-03 NOTE — Progress Notes (Signed)
Office: [phone]  /  Fax: [phone] TeleHealth Visit:  Nanci PinaHeather J Fitzgerald has verbally consented to this TeleHealth visit today. The patient is located at home, the provider is located at the [facility] office. The participants in this visit include the listed provider and patient. The visit was conducted today via face time.  HPI:   Chief Complaint: OBESITY [name] is here to discuss her progress with her obesity treatment plan. She is on the Category 3 plan and is following her eating plan approximately 65 % of the time. She states she is exercising 0 minutes 0 times per week. [name] is at home. She voices work has been stressful. She is still going into work for some hours. She is stress eating at work for chocolate fix. She is finding she is doing stress snacking.  We were unable to weigh the patient today for this TeleHealth visit. She feels as if she has lost 2 and 1/2 lbs since her last visit. She has lost 8-10 lbs since starting treatment with us.  Insulin Resistance [name] has a diagnosis of insulin resistance based on her elevated fasting insulin level >5. Although Leler's blood glucose readings are still under good control, insulin resistance puts her at greater risk of metabolic syndrome and diabetes. She is not on medications and notes occasional carbohydrate cravings. She continues to work on diet and exercise to decrease risk of diabetes.  Vitamin D Deficiency [name] has a diagnosis of vitamin D deficiency. She is currently taking prescription Vit D. She notes fatigue and denies nausea, vomiting or muscle weakness.  ASSESSMENT AND PLAN:  Insulin resistance  Vitamin D deficiency  Class 2 severe obesity with serious comorbidity and body mass index (BMI) of 37.0 to 37.9 in adult, unspecified obesity type (HCC)  PLAN:  Insulin Resistance [name] will continue to work on weight loss, exercise, and decreasing simple carbohydrates in her diet to  help decrease the risk of diabetes. We dicussed metformin including benefits and risks. She was informed that eating too many simple carbohydrates or too many calories at one sitting increases the likelihood of GI side effects. Andie declined metformin for now and prescription was not written today. We will follow up on labs in early June. [name] agrees to follow up with our clinic in 2 weeks as directed to monitor her progress.  Vitamin D Deficiency [name] was informed that low vitamin D levels contributes to fatigue and are associated with obesity, breast, and colon cancer. [name] agrees to continue taking prescription Vit D @50 ,000 IU every week and will follow up for routine testing of vitamin D, at least 2-3 times per year. She was informed of the risk of over-replacement of vitamin D and agrees to not increase her dose unless she discusses this with us first. [name] agrees to follow up with our clinic in 2 weeks.  Obesity [name] is currently in the action stage of change. As such, her goal is to continue with weight loss efforts She has agreed to follow the Category 3 plan [name] has been instructed to work up to a goal of 150 minutes of combined cardio and strengthening exercise per week or plan to start boxing program for 3 weeks for weight loss and overall health benefits. We discussed the following Behavioral Modification Strategies today: increasing lean protein intake, emotional eating strategies, ways to avoid boredom eating, ways to avoid night time snacking, increase H20 intake, and planning for success   [name] has agreed to follow up  with our clinic in 2 weeks. She was informed of the importance of frequent follow up visits to maximize her success with intensive lifestyle modifications for her multiple health conditions.  ALLERGIES: Allergies  Allergen Reactions  . Sulfa Antibiotics Rash    MEDICATIONS: Current Outpatient Medications on File Prior to Visit  Medication  Sig Dispense Refill  . Cetirizine HCl (ZYRTEC ALLERGY) 10 MG CAPS Take 1 capsule by mouth daily.    . Cranberry 1000 MG CAPS Take 1 capsule by mouth daily.    [name] ibuprofen (ADVIL,MOTRIN) 100 MG tablet Take 100 mg by mouth every 6 (six) hours as needed for fever.    [name] Oil 1000 MG CAPS Take 1 capsule by mouth daily.    [name] levonorgestrel-ethinyl estradiol (AVIANE) 0.1-20 MG-MCG tablet Take 1 tablet by mouth daily.    . Probiotic Product (PROBIOTIC-10 PO) Take 1 capsule by mouth daily.    . pseudoephedrine (SUDAFED) 30 MG tablet Take 30 mg by mouth every 4 (four) hours as needed for congestion.    . Vitamin D, Ergocalciferol, (DRISDOL) 1.25 MG (50000 UT) CAPS capsule Take 1 capsule (50,000 Units total) by mouth every 7 (seven) days. 4 capsule 0   No current facility-administered medications on file prior to visit.     PAST MEDICAL HISTORY: Past Medical History:  Diagnosis Date  . Back pain   . Joint pain   . Kidney problem   . Swallowing difficulty     PAST SURGICAL HISTORY: Past Surgical History:  Procedure Laterality Date  . CESAREAN SECTION     x2    SOCIAL HISTORY: Social History   Tobacco Use  . Smoking status: Never Smoker  . Smokeless tobacco: Never Used  Substance Use Topics  . Alcohol use: Not Currently  . Drug use: Never    FAMILY HISTORY: History reviewed. No pertinent family history.  ROS: Review of Systems  Constitutional: Positive for malaise/fatigue and weight loss.  Gastrointestinal: Negative for nausea and vomiting.  Musculoskeletal:       Negative muscle weakness    PHYSICAL EXAM: Pt in no acute distress  RECENT LABS AND TESTS: BMET    Component Value Date/Time   NA 146 (H) 09/08/2018 1207   K 4.8 09/08/2018 1207   CL 106 09/08/2018 1207   CO2 22 09/08/2018 1207   GLUCOSE 86 09/08/2018 1207   BUN 10 09/08/2018 1207   CREATININE 0.69 09/08/2018 1207   CALCIUM 9.2 09/08/2018 1207   GFRNONAA 108 09/08/2018 1207   GFRAA 125 09/08/2018  1207   Lab Results  Component Value Date   HGBA1C 5.3 09/08/2018   Lab Results  Component Value Date   INSULIN 18.7 09/08/2018   CBC    Component Value Date/Time   WBC 10.0 09/08/2018 1207   WBC 8.9 07/07/2008 0505   RBC 4.71 09/08/2018 1207   RBC 3.90 07/07/2008 0505   HGB 13.4 09/08/2018 1207   HCT 40.6 09/08/2018 1207   PLT 254 07/07/2008 0505   MCV 86 09/08/2018 1207   MCH 28.5 09/08/2018 1207   MCHC 33.0 09/08/2018 1207   MCHC 34.2 07/07/2008 0505   RDW 12.1 09/08/2018 1207   LYMPHSABS 2.0 09/08/2018 1207   EOSABS 0.1 09/08/2018 1207   BASOSABS 0.0 09/08/2018 1207   Iron/TIBC/Ferritin/ %Sat No results found for: IRON, TIBC, FERRITIN, IRONPCTSAT Lipid Panel     Component Value Date/Time   CHOL 223 (H) 09/08/2018 1207   TRIG 82 09/08/2018 1207   HDL 77 09/08/2018  1207   LDLCALC 130 (H) 09/08/2018 1207   Hepatic Function Panel     Component Value Date/Time   PROT 7.2 09/08/2018 1207   ALBUMIN 4.2 09/08/2018 1207   AST 14 09/08/2018 1207   ALT 16 09/08/2018 1207   ALKPHOS 90 09/08/2018 1207   BILITOT <0.2 09/08/2018 1207      Component Value Date/Time   TSH 0.640 09/08/2018 1207      I, Burt Knack, am acting as transcriptionist for Debbra Riding, MD  I have reviewed the above documentation for accuracy and completeness, and I agree with the above. - Debbra Riding, MD

## 2018-11-13 ENCOUNTER — Ambulatory Visit (INDEPENDENT_AMBULATORY_CARE_PROVIDER_SITE_OTHER): Payer: BLUE CROSS/BLUE SHIELD | Admitting: Family Medicine

## 2018-11-13 ENCOUNTER — Encounter (INDEPENDENT_AMBULATORY_CARE_PROVIDER_SITE_OTHER): Payer: Self-pay | Admitting: Family Medicine

## 2018-11-13 ENCOUNTER — Other Ambulatory Visit: Payer: Self-pay

## 2018-11-13 DIAGNOSIS — E559 Vitamin D deficiency, unspecified: Secondary | ICD-10-CM

## 2018-11-13 DIAGNOSIS — Z6837 Body mass index (BMI) 37.0-37.9, adult: Secondary | ICD-10-CM

## 2018-11-13 DIAGNOSIS — E7849 Other hyperlipidemia: Secondary | ICD-10-CM | POA: Diagnosis not present

## 2018-11-13 MED ORDER — VITAMIN D (ERGOCALCIFEROL) 1.25 MG (50000 UNIT) PO CAPS: 50000.0000 [IU] | ORAL_CAPSULE | ORAL | 0 refills | Status: DC

## 2018-11-13 NOTE — Progress Notes (Signed)
Office: [phone]  /  Fax: [phone] TeleHealth Visit:  [name] has verbally consented to this TeleHealth visit today. The patient is located at home, the provider is located at the [facility] office. The participants in this visit include the listed provider and patient. The visit was conducted today via face time.  HPI:   Chief Complaint: OBESITY [name] is here to discuss her progress with her obesity treatment plan. She is on the Category 3 plan and is following her eating plan approximately 70 % of the time. She states she is walking for 30 minutes 2 times per week. [name] voices the last 2 weeks have been better than the previous 2 weeks. Her weight today is 220 lbs. She has no difficulty finding food on the plan. She voices she is occasionally eating off the plan with takeout.  We were unable to weigh the patient today for this TeleHealth visit. She feels as if she has maintained her weight since her last visit. She has lost 8 lbs since starting treatment with [place].  Vitamin D Deficiency [name] has a diagnosis of vitamin D deficiency. She is currently taking prescription Vit D and denies nausea, vomiting or muscle weakness.  Hyperlipidemia [name] has hyperlipidemia and has been trying to improve her cholesterol levels with intensive lifestyle modification including a low saturated fat diet, exercise and weight loss. Last LDL was of 130 and HDL of 77. She denies any chest pain, claudication or myalgias.  ASSESSMENT AND PLAN:  Vitamin D deficiency - Plan: Vitamin D, Ergocalciferol, (DRISDOL) 1.25 MG (50000 UT) CAPS capsule  Other hyperlipidemia  Class 2 severe obesity with serious comorbidity and body mass index (BMI) of 37.0 to 37.9 in adult, unspecified obesity type (HCC)  PLAN:  Vitamin D Deficiency [name] was informed that low vitamin D levels contributes to fatigue and are associated with obesity, breast, and colon cancer. [name] agrees  to continue taking prescription Vit D @50 ,000 IU every week #4 and we will refill for 1 month. She will follow up for routine testing of vitamin D, at least 2-3 times per year. She was informed of the risk of over-replacement of vitamin D and agrees to not increase her dose unless she discusses this with [place] first. [name] agrees to follow up with our clinic in 2 weeks.  Hyperlipidemia [name] was informed of the American Heart Association Guidelines emphasizing intensive lifestyle modifications as the first line treatment for hyperlipidemia. We discussed many lifestyle modifications today in depth, and [name] will continue to work on decreasing saturated fats such as fatty red meat, butter and many fried foods. She will also increase vegetables and lean protein in her diet and continue to work on exercise and weight loss efforts. We will recheck labs in early June. [name] agrees to follow up with our clinic in 2 weeks.  Obesity [name] is currently in the action stage of change. As such, her goal is to continue with weight loss efforts She has agreed to follow the Category 3 plan [name] has been instructed to work up to a goal of 150 minutes of combined cardio and strengthening exercise per week for weight loss and overall health benefits. We discussed the following Behavioral Modification Strategies today: increasing lean protein intake, increasing vegetables, decrease eating out and work on meal planning and easy cooking plans, no skipping meals, and planning for success   [name] has agreed to follow up with our clinic in 2 weeks. She was informed of the  importance of frequent follow up visits to maximize her success with intensive lifestyle modifications for her multiple health conditions.  ALLERGIES: Allergies  Allergen Reactions  . Sulfa Antibiotics Rash    MEDICATIONS: Current Outpatient Medications on File Prior to Visit  Medication Sig Dispense Refill  . Cetirizine HCl (ZYRTEC  ALLERGY) 10 MG CAPS Take 1 capsule by mouth daily.    . Cranberry 1000 MG CAPS Take 1 capsule by mouth daily.    [name]. ibuprofen (ADVIL,MOTRIN) 100 MG tablet Take 100 mg by mouth every 6 (six) hours as needed for fever.    [name]. Krill Oil 1000 MG CAPS Take 1 capsule by mouth daily.    [name]. levonorgestrel-ethinyl estradiol (AVIANE) 0.1-20 MG-MCG tablet Take 1 tablet by mouth daily.    . Probiotic Product (PROBIOTIC-10 PO) Take 1 capsule by mouth daily.    . pseudoephedrine (SUDAFED) 30 MG tablet Take 30 mg by mouth every 4 (four) hours as needed for congestion.    . Vitamin D, Ergocalciferol, (DRISDOL) 1.25 MG (50000 UT) CAPS capsule Take 1 capsule (50,000 Units total) by mouth every 7 (seven) days. 4 capsule 0   No current facility-administered medications on file prior to visit.     PAST MEDICAL HISTORY: Past Medical History:  Diagnosis Date  . Back pain   . Joint pain   . Kidney problem   . Swallowing difficulty     PAST SURGICAL HISTORY: Past Surgical History:  Procedure Laterality Date  . CESAREAN SECTION     x2    SOCIAL HISTORY: Social History   Tobacco Use  . Smoking status: Never Smoker  . Smokeless tobacco: Never Used  Substance Use Topics  . Alcohol use: Not Currently  . Drug use: Never    FAMILY HISTORY: History reviewed. No pertinent family history.  ROS: Review of Systems  Constitutional: Positive for malaise/fatigue.  Cardiovascular: Negative for chest pain and claudication.  Gastrointestinal: Negative for nausea and vomiting.  Musculoskeletal: Negative for myalgias.       Negative muscle weakness    PHYSICAL EXAM: Pt in no acute distress  RECENT LABS AND TESTS: BMET    Component Value Date/Time   NA 146 (H) 09/08/2018 1207   K 4.8 09/08/2018 1207   CL 106 09/08/2018 1207   CO2 22 09/08/2018 1207   GLUCOSE 86 09/08/2018 1207   BUN 10 09/08/2018 1207   CREATININE 0.69 09/08/2018 1207   CALCIUM 9.2 09/08/2018 1207   GFRNONAA 108 09/08/2018 1207    GFRAA 125 09/08/2018 1207   Lab Results  Component Value Date   HGBA1C 5.3 09/08/2018   Lab Results  Component Value Date   INSULIN 18.7 09/08/2018   CBC    Component Value Date/Time   WBC 10.0 09/08/2018 1207   WBC 8.9 07/07/2008 0505   RBC 4.71 09/08/2018 1207   RBC 3.90 07/07/2008 0505   HGB 13.4 09/08/2018 1207   HCT 40.6 09/08/2018 1207   PLT 254 07/07/2008 0505   MCV 86 09/08/2018 1207   MCH 28.5 09/08/2018 1207   MCHC 33.0 09/08/2018 1207   MCHC 34.2 07/07/2008 0505   RDW 12.1 09/08/2018 1207   LYMPHSABS 2.0 09/08/2018 1207   EOSABS 0.1 09/08/2018 1207   BASOSABS 0.0 09/08/2018 1207   Iron/TIBC/Ferritin/ %Sat No results found for: IRON, TIBC, FERRITIN, IRONPCTSAT Lipid Panel     Component Value Date/Time   CHOL 223 (H) 09/08/2018 1207   TRIG 82 09/08/2018 1207   HDL 77 09/08/2018 1207  LDLCALC 130 (H) 09/08/2018 1207   Hepatic Function Panel     Component Value Date/Time   PROT 7.2 09/08/2018 1207   ALBUMIN 4.2 09/08/2018 1207   AST 14 09/08/2018 1207   ALT 16 09/08/2018 1207   ALKPHOS 90 09/08/2018 1207   BILITOT <0.2 09/08/2018 1207      Component Value Date/Time   TSH 0.640 09/08/2018 1207      I, Burt Knack, am acting as transcriptionist for Debbra Riding, MD  I have reviewed the above documentation for accuracy and completeness, and I agree with the above. - Debbra Riding, MD

## 2018-11-27 ENCOUNTER — Ambulatory Visit (INDEPENDENT_AMBULATORY_CARE_PROVIDER_SITE_OTHER): Payer: BLUE CROSS/BLUE SHIELD | Admitting: Family Medicine

## 2018-11-27 ENCOUNTER — Encounter (INDEPENDENT_AMBULATORY_CARE_PROVIDER_SITE_OTHER): Payer: Self-pay | Admitting: Family Medicine

## 2018-11-27 ENCOUNTER — Other Ambulatory Visit: Payer: Self-pay

## 2018-11-27 DIAGNOSIS — Z6837 Body mass index (BMI) 37.0-37.9, adult: Secondary | ICD-10-CM | POA: Diagnosis not present

## 2018-11-27 DIAGNOSIS — F3289 Other specified depressive episodes: Secondary | ICD-10-CM | POA: Diagnosis not present

## 2018-11-27 DIAGNOSIS — E8881 Metabolic syndrome: Secondary | ICD-10-CM | POA: Diagnosis not present

## 2018-11-27 MED ORDER — BUPROPION HCL ER (SR) 150 MG PO TB12: 150.0000 mg | ORAL_TABLET | Freq: Every day | ORAL | 0 refills | Status: DC

## 2018-11-27 NOTE — Progress Notes (Signed)
Office: [phone]  /  Fax: [phone] TeleHealth Visit:  [name] has verbally consented to this TeleHealth visit today. The patient is located at home, the provider is located at the [facility] office. The participants in this visit include the listed provider and patient. The visit was conducted today via face time.  HPI:   Chief Complaint: OBESITY [name] is here to discuss her progress with her obesity treatment plan. She is on the Category 3 plan and is following her eating plan approximately 60 % of the time. She states she is walking for 30 minutes 2-3 times per week. [name] voices increase in sweet cravings especially for peanut M&M's, as these are available at work. She denies hunger and has no difficulty finding food.  We were unable to weigh the patient today for this TeleHealth visit. She feels as if she has lost 1 lb since her last visit. She has lost 8-9 lbs since starting treatment with [place].  Insulin Resistance [name] has a diagnosis of insulin resistance based on her elevated fasting insulin level >5. Although [name]'s blood glucose readings are still under good control, insulin resistance puts her at greater risk of metabolic syndrome and diabetes. She is not taking metformin currently and notes cravings for sweets especially peanut M&M's. She denies feelings of hypoglycemia and continues to work on diet and exercise to decrease risk of diabetes.  Depression with Emotional Eating Behaviors [name] notes cravings for sweets especially peanut M&M's. She denies a history of seizure diagnosis. [name] struggles with emotional eating and using food for comfort to the extent that it is negatively impacting her health. She often snacks when she is not hungry. [name] sometimes feels she is out of control and then feels guilty that she made poor food choices. She has been working on behavior modification techniques to help reduce her emotional eating and  has been somewhat successful. She shows no sign of suicidal or homicidal ideations.  Depression screen [facility] 2/9 09/08/2018  Decreased Interest 2  Down, Depressed, Hopeless 1  PHQ - 2 Score 3  Altered sleeping 0  Tired, decreased energy 3  Change in appetite 2  Feeling bad or failure about yourself  1  Trouble concentrating 1  Moving slowly or fidgety/restless 0  Suicidal thoughts 0  PHQ-9 Score 10  Difficult doing work/chores Not difficult at all    ASSESSMENT AND PLAN:  Other depression - with emotional eating - Plan: buPROPion (WELLBUTRIN SR) 150 MG 12 hr tablet  Insulin resistance  Class 2 severe obesity with serious comorbidity and body mass index (BMI) of 37.0 to 37.9 in adult, unspecified obesity type (HCC)  PLAN:  Insulin Resistance Aleida will continue to work on weight loss, exercise, and decreasing simple carbohydrates in her diet to help decrease the risk of diabetes. We dicussed metformin including benefits and risks. She was informed that eating too many simple carbohydrates or too many calories at one sitting increases the likelihood of GI side effects. [name] declined metformin for now and prescription was not written today. We will retest labs at her first in person appointment. [name] agrees to follow up with our clinic in 2 weeks as directed to monitor her progress.  Depression with Emotional Eating Behaviors We discussed behavior modification techniques today to help [name] deal with her emotional eating and depression. [name] agrees to start Wellbutrin SR 150 mg PO q AM #30 with no refills. [name] agrees to follow up with our clinic in 2 weeks.  Obesity [name] is currently in the action stage of change. As such, her goal is to continue with weight loss efforts She has agreed to follow the Category 3 plan [name] has been instructed to work up to a goal of 150 minutes of combined cardio and strengthening exercise per week for weight loss and overall health  benefits. We discussed the following Behavioral Modification Strategies today: increasing lean protein intake and work on meal planning and easy cooking plans, keeping healthy foods in the home, better snacking choices, and planning for success   [name] has agreed to follow up with our clinic in 2 weeks. She was informed of the importance of frequent follow up visits to maximize her success with intensive lifestyle modifications for her multiple health conditions.  ALLERGIES: Allergies  Allergen Reactions  . Sulfa Antibiotics Rash    MEDICATIONS: Current Outpatient Medications on File Prior to Visit  Medication Sig Dispense Refill  . Cetirizine HCl (ZYRTEC ALLERGY) 10 MG CAPS Take 1 capsule by mouth daily.    . Cranberry 1000 MG CAPS Take 1 capsule by mouth daily.    [name] ibuprofen (ADVIL,MOTRIN) 100 MG tablet Take 100 mg by mouth every 6 (six) hours as needed for fever.    [name] Oil 1000 MG CAPS Take 1 capsule by mouth daily.    [name] levonorgestrel-ethinyl estradiol (AVIANE) 0.1-20 MG-MCG tablet Take 1 tablet by mouth daily.    . Probiotic Product (PROBIOTIC-10 PO) Take 1 capsule by mouth daily.    . pseudoephedrine (SUDAFED) 30 MG tablet Take 30 mg by mouth every 4 (four) hours as needed for congestion.    . Vitamin D, Ergocalciferol, (DRISDOL) 1.25 MG (50000 UT) CAPS capsule Take 1 capsule (50,000 Units total) by mouth every 7 (seven) days. 4 capsule 0   No current facility-administered medications on file prior to visit.     PAST MEDICAL HISTORY: Past Medical History:  Diagnosis Date  . Back pain   . Joint pain   . Kidney problem   . Swallowing difficulty     PAST SURGICAL HISTORY: Past Surgical History:  Procedure Laterality Date  . CESAREAN SECTION     x2    SOCIAL HISTORY: Social History   Tobacco Use  . Smoking status: Never Smoker  . Smokeless tobacco: Never Used  Substance Use Topics  . Alcohol use: Not Currently  . Drug use: Never    FAMILY HISTORY:  History reviewed. No pertinent family history.  ROS: Review of Systems  Constitutional: Positive for weight loss.  Endo/Heme/Allergies:       Negative hypoglycemia  Psychiatric/Behavioral: Positive for depression. Negative for suicidal ideas.    PHYSICAL EXAM: Pt in no acute distress  RECENT LABS AND TESTS: BMET    Component Value Date/Time   NA 146 (H) 09/08/2018 1207   K 4.8 09/08/2018 1207   CL 106 09/08/2018 1207   CO2 22 09/08/2018 1207   GLUCOSE 86 09/08/2018 1207   BUN 10 09/08/2018 1207   CREATININE 0.69 09/08/2018 1207   CALCIUM 9.2 09/08/2018 1207   GFRNONAA 108 09/08/2018 1207   GFRAA 125 09/08/2018 1207   Lab Results  Component Value Date   HGBA1C 5.3 09/08/2018   Lab Results  Component Value Date   INSULIN 18.7 09/08/2018   CBC    Component Value Date/Time   WBC 10.0 09/08/2018 1207   WBC 8.9 07/07/2008 0505   RBC 4.71 09/08/2018 1207   RBC 3.90 07/07/2008 0505   HGB 13.4 09/08/2018 1207  HCT 40.6 09/08/2018 1207   PLT 254 07/07/2008 0505   MCV 86 09/08/2018 1207   MCH 28.5 09/08/2018 1207   MCHC 33.0 09/08/2018 1207   MCHC 34.2 07/07/2008 0505   RDW 12.1 09/08/2018 1207   LYMPHSABS 2.0 09/08/2018 1207   EOSABS 0.1 09/08/2018 1207   BASOSABS 0.0 09/08/2018 1207   Iron/TIBC/Ferritin/ %Sat No results found for: IRON, TIBC, FERRITIN, IRONPCTSAT Lipid Panel     Component Value Date/Time   CHOL 223 (H) 09/08/2018 1207   TRIG 82 09/08/2018 1207   HDL 77 09/08/2018 1207   LDLCALC 130 (H) 09/08/2018 1207   Hepatic Function Panel     Component Value Date/Time   PROT 7.2 09/08/2018 1207   ALBUMIN 4.2 09/08/2018 1207   AST 14 09/08/2018 1207   ALT 16 09/08/2018 1207   ALKPHOS 90 09/08/2018 1207   BILITOT <0.2 09/08/2018 1207      Component Value Date/Time   TSH 0.640 09/08/2018 1207      I, Burt KnackSharon Martin, am acting as transcriptionist for Debbra RidingAlexandria Kadolph, MD  I have reviewed the above documentation for accuracy and  completeness, and I agree with the above. - Debbra RidingAlexandria Kadolph, MD

## 2018-12-11 ENCOUNTER — Encounter (INDEPENDENT_AMBULATORY_CARE_PROVIDER_SITE_OTHER): Payer: Self-pay | Admitting: Family Medicine

## 2018-12-11 ENCOUNTER — Ambulatory Visit (INDEPENDENT_AMBULATORY_CARE_PROVIDER_SITE_OTHER): Payer: BLUE CROSS/BLUE SHIELD | Admitting: Family Medicine

## 2018-12-11 ENCOUNTER — Other Ambulatory Visit: Payer: Self-pay

## 2018-12-11 DIAGNOSIS — E559 Vitamin D deficiency, unspecified: Secondary | ICD-10-CM | POA: Diagnosis not present

## 2018-12-11 DIAGNOSIS — E7849 Other hyperlipidemia: Secondary | ICD-10-CM

## 2018-12-11 DIAGNOSIS — Z6837 Body mass index (BMI) 37.0-37.9, adult: Secondary | ICD-10-CM

## 2018-12-15 ENCOUNTER — Other Ambulatory Visit (INDEPENDENT_AMBULATORY_CARE_PROVIDER_SITE_OTHER): Payer: Self-pay | Admitting: Family Medicine

## 2018-12-15 DIAGNOSIS — E559 Vitamin D deficiency, unspecified: Secondary | ICD-10-CM

## 2018-12-15 MED ORDER — VITAMIN D (ERGOCALCIFEROL) 1.25 MG (50000 UNIT) PO CAPS: 50000.0000 [IU] | ORAL_CAPSULE | ORAL | 0 refills | Status: DC

## 2018-12-18 NOTE — Progress Notes (Signed)
Office: [phone]  /  Fax: [phone] TeleHealth Visit:  TANAISHA POHL has verbally consented to this TeleHealth visit today. The patient is located at home, the provider is located at the [facility] office. The participants in this visit include the listed provider and patient. The visit was conducted today via face time.  HPI:   Chief Complaint: OBESITY Hermione is here to discuss her progress with her obesity treatment plan. She is on the Category 3 plan and is following her eating plan approximately 75 % of the time. She states she is exercising 0 minutes 0 times per week. Pressley is not weighing herself at home. She has no difficulty finding any of the food on the plan. She is not always eating all of her food at certain meals, especially at lunch. No changes in terms of stress at work. The weather has been a deterrent form her being able to walk outside.  We were unable to weigh the patient today for this TeleHealth visit. She feels as if she has lost 2 lbs since her last visit. She has lost 9-11 lbs since starting treatment with [place].  Vitamin D Deficiency Janijah has a diagnosis of vitamin D deficiency. She is currently taking prescription Vit D. She notes fatigue and denies nausea, vomiting or muscle weakness.  Hyperlipidemia Denesa has hyperlipidemia and has been trying to improve her cholesterol levels with intensive lifestyle modification including a low saturated fat diet, exercise and weight loss. Last LDL was of 130 and HDL of 77. She is not on statin and denies any chest pain, claudication or myalgias.  ASSESSMENT AND PLAN:  Vitamin D deficiency - Plan: Vitamin D, Ergocalciferol, (DRISDOL) 1.25 MG (50000 UT) CAPS capsule  Other hyperlipidemia  Class 2 severe obesity with serious comorbidity and body mass index (BMI) of 37.0 to 37.9 in adult, unspecified obesity type (HCC)  PLAN:  Vitamin D Deficiency Kenetra was informed that low vitamin D  levels contributes to fatigue and are associated with obesity, breast, and colon cancer. Candia agrees to continue taking prescription Vit D @50 ,000 IU every week #4 and we will refill for 1 month. She will follow up for routine testing of vitamin D, at least 2-3 times per year. She was informed of the risk of over-replacement of vitamin D and agrees to not increase her dose unless she discusses this with [place] first. [place] agrees to follow up with our clinic in 2 weeks.  Hyperlipidemia Ely was informed of the American Heart Association Guidelines emphasizing intensive lifestyle modifications as the first line treatment for hyperlipidemia. We discussed many lifestyle modifications today in depth, and Lajuanna will continue to work on decreasing saturated fats such as fatty red meat, butter and many fried foods. She will also increase vegetables and lean protein in her diet and continue to work on exercise and weight loss efforts. We will repeat labs at her first in person appointment. Anahat agrees to follow up with our clinic in 2 weeks.  Obesity Ladeidra is currently in the action stage of change. As such, her goal is to continue with weight loss efforts She has agreed to follow the Category 3 plan Jennavieve has been instructed to work up to a goal of 150 minutes of combined cardio and strengthening exercise per week or walk or physical activity for 15-30 minutes 2-3 times per week for weight loss and overall health benefits. We discussed the following Behavioral Modification Strategies today: increasing lean protein intake, increasing vegetables and  work on meal planning and easy cooking plans, keeping healthy foods in the home, and planning for success   [name] has agreed to follow up with our clinic in 2 weeks. She was informed of the importance of frequent follow up visits to maximize her success with intensive lifestyle modifications for her multiple health conditions.  ALLERGIES: Allergies   Allergen Reactions  . Sulfa Antibiotics Rash    MEDICATIONS: Current Outpatient Medications on File Prior to Visit  Medication Sig Dispense Refill  . buPROPion (WELLBUTRIN SR) 150 MG 12 hr tablet Take 1 tablet (150 mg total) by mouth daily. 30 tablet 0  . Cetirizine HCl (ZYRTEC ALLERGY) 10 MG CAPS Take 1 capsule by mouth daily.    . Cranberry 1000 MG CAPS Take 1 capsule by mouth daily.    [name]. ibuprofen (ADVIL,MOTRIN) 100 MG tablet Take 100 mg by mouth every 6 (six) hours as needed for fever.    [name]. Krill Oil 1000 MG CAPS Take 1 capsule by mouth daily.    [name]. levonorgestrel-ethinyl estradiol (AVIANE) 0.1-20 MG-MCG tablet Take 1 tablet by mouth daily.    . Probiotic Product (PROBIOTIC-10 PO) Take 1 capsule by mouth daily.    . pseudoephedrine (SUDAFED) 30 MG tablet Take 30 mg by mouth every 4 (four) hours as needed for congestion.     No current facility-administered medications on file prior to visit.     PAST MEDICAL HISTORY: Past Medical History:  Diagnosis Date  . Back pain   . Joint pain   . Kidney problem   . Swallowing difficulty     PAST SURGICAL HISTORY: Past Surgical History:  Procedure Laterality Date  . CESAREAN SECTION     x2    SOCIAL HISTORY: Social History   Tobacco Use  . Smoking status: Never Smoker  . Smokeless tobacco: Never Used  Substance Use Topics  . Alcohol use: Not Currently  . Drug use: Never    FAMILY HISTORY: History reviewed. No pertinent family history.  ROS: Review of Systems  Constitutional: Positive for malaise/fatigue and weight loss.  Cardiovascular: Negative for chest pain and claudication.  Gastrointestinal: Negative for nausea and vomiting.  Musculoskeletal: Negative for myalgias.       Negative muscle weakness    PHYSICAL EXAM: Pt in no acute distress  RECENT LABS AND TESTS: BMET    Component Value Date/Time   NA 146 (H) 09/08/2018 1207   K 4.8 09/08/2018 1207   CL 106 09/08/2018 1207   CO2 22 09/08/2018 1207    GLUCOSE 86 09/08/2018 1207   BUN 10 09/08/2018 1207   CREATININE 0.69 09/08/2018 1207   CALCIUM 9.2 09/08/2018 1207   GFRNONAA 108 09/08/2018 1207   GFRAA 125 09/08/2018 1207   Lab Results  Component Value Date   HGBA1C 5.3 09/08/2018   Lab Results  Component Value Date   INSULIN 18.7 09/08/2018   CBC    Component Value Date/Time   WBC 10.0 09/08/2018 1207   WBC 8.9 07/07/2008 0505   RBC 4.71 09/08/2018 1207   RBC 3.90 07/07/2008 0505   HGB 13.4 09/08/2018 1207   HCT 40.6 09/08/2018 1207   PLT 254 07/07/2008 0505   MCV 86 09/08/2018 1207   MCH 28.5 09/08/2018 1207   MCHC 33.0 09/08/2018 1207   MCHC 34.2 07/07/2008 0505   RDW 12.1 09/08/2018 1207   LYMPHSABS 2.0 09/08/2018 1207   EOSABS 0.1 09/08/2018 1207   BASOSABS 0.0 09/08/2018 1207   Iron/TIBC/Ferritin/ %Sat No results found  for: IRON, TIBC, FERRITIN, IRONPCTSAT Lipid Panel     Component Value Date/Time   CHOL 223 (H) 09/08/2018 1207   TRIG 82 09/08/2018 1207   HDL 77 09/08/2018 1207   LDLCALC 130 (H) 09/08/2018 1207   Hepatic Function Panel     Component Value Date/Time   PROT 7.2 09/08/2018 1207   ALBUMIN 4.2 09/08/2018 1207   AST 14 09/08/2018 1207   ALT 16 09/08/2018 1207   ALKPHOS 90 09/08/2018 1207   BILITOT <0.2 09/08/2018 1207      Component Value Date/Time   TSH 0.640 09/08/2018 1207      I, Burt Knack, am acting as transcriptionist for Debbra Riding, MD  I have reviewed the above documentation for accuracy and completeness, and I agree with the above. - Debbra Riding, MD

## 2018-12-26 ENCOUNTER — Other Ambulatory Visit (INDEPENDENT_AMBULATORY_CARE_PROVIDER_SITE_OTHER): Payer: Self-pay | Admitting: Family Medicine

## 2018-12-26 DIAGNOSIS — F3289 Other specified depressive episodes: Secondary | ICD-10-CM

## 2018-12-29 ENCOUNTER — Encounter (INDEPENDENT_AMBULATORY_CARE_PROVIDER_SITE_OTHER): Payer: Self-pay | Admitting: Family Medicine

## 2018-12-29 ENCOUNTER — Other Ambulatory Visit: Payer: Self-pay

## 2018-12-29 ENCOUNTER — Ambulatory Visit (INDEPENDENT_AMBULATORY_CARE_PROVIDER_SITE_OTHER): Payer: BC Managed Care – PPO | Admitting: Family Medicine

## 2018-12-29 DIAGNOSIS — F3289 Other specified depressive episodes: Secondary | ICD-10-CM

## 2018-12-29 DIAGNOSIS — E8881 Metabolic syndrome: Secondary | ICD-10-CM

## 2018-12-29 DIAGNOSIS — Z6837 Body mass index (BMI) 37.0-37.9, adult: Secondary | ICD-10-CM | POA: Diagnosis not present

## 2018-12-29 MED ORDER — BUPROPION HCL ER (SR) 150 MG PO TB12: 150.0000 mg | ORAL_TABLET | Freq: Every day | ORAL | 0 refills | Status: DC

## 2018-12-30 NOTE — Progress Notes (Signed)
Office: [phone]  /  Fax: [phone] TeleHealth Visit:  Christy PinaHeather J Fitzgerald has verbally consented to this TeleHealth visit today. The patient is located at home, the provider is located at the [facility]. The participants in this visit include the listed provider and patient. The visit was conducted today via face time.  HPI:   Chief Complaint: OBESITY [name] is here to discuss her progress with her obesity treatment plan. She is on the Category 3 plan and is following her eating plan approximately 75 % of the time. She states she is walking for 20-30 minutes 2 times per week. [name]'s weight is of 216 lbs today. She feels Wellbutrin has really helped. She finds lunch to be most difficult, but she is getting better at bringing lunch. She is going camping for a week in July.  We were unable to weigh the patient today for this TeleHealth visit. She feels as if she has maintained her weight since her last visit. She has lost 11 lbs since starting treatment with us.  Insulin Resistance [name] has a diagnosis of insulin resistance based on her elevated fasting insulin level >5. Although [name]'s blood glucose readings are still under good control, insulin resistance puts her at greater risk of metabolic syndrome and diabetes. She denies carbohydrate cravings with the start of Wellbutrin. She is not taking metformin currently and continues to work on diet and exercise to decrease risk of diabetes.  Depression with Emotional Eating Behaviors [name] denies side effects of Wellbutrin. [name] struggles with emotional eating and using food for comfort to the extent that it is negatively impacting her health. She often snacks when she is not hungry. [name] sometimes feels she is out of control and then feels guilty that she made poor food choices. She has been working on behavior modification techniques to help reduce her emotional eating and has been somewhat successful.  She shows no sign of suicidal or homicidal ideations.  [facility]  Decreased Interest 2  Down, Depressed, Hopeless 1  PHQ - 2 Score 3  Altered sleeping 0  Tired, decreased energy 3  Change in appetite 2  Feeling bad or failure about yourself  1  Trouble concentrating 1  Moving slowly or fidgety/restless 0  Suicidal thoughts 0  PHQ-9 Score 10  Difficult doing work/chores Not difficult at all    ASSESSMENT AND PLAN:  Insulin resistance  Other depression - with emotional eating - Plan: buPROPion (WELLBUTRIN SR) 150 MG 12 hr tablet  Class 2 severe obesity with serious comorbidity and body mass index (BMI) of 37.0 to 37.9 in adult, unspecified obesity type (HCC)  PLAN:  Insulin Resistance [name] will continue to work on weight loss, exercise, and decreasing simple carbohydrates in her diet to help decrease the risk of diabetes. We dicussed metformin including benefits and risks. She was informed that eating too many simple carbohydrates or too many calories at one sitting increases the likelihood of GI side effects. [name] declined metformin for now and prescription was not written today. We will repeat labs at her first in office appointment. [name] agrees to follow up with our clinic in 2 weeks as directed to monitor her progress.  Depression with Emotional Eating Behaviors We discussed behavior modification techniques today to help [name] deal with her emotional eating and depression. [name] agrees to continue taking Wellbutrin SR 150 mg PO daily #30 and we will refill for 1 month. [name] agrees to follow up with our clinic  in 2 weeks.  Obesity [name] is currently in the action stage of change. As such, her goal is to continue with weight loss efforts She has agreed to follow the Category 3 plan [name] has been instructed to work up to a goal of 150 minutes of combined cardio and strengthening exercise per week for weight loss and overall health  benefits. We discussed the following Behavioral Modification Strategies today: increasing lean protein intake, increasing vegetables and work on meal planning and easy cooking plans, keeping healthy foods in the home, better snacking choices, and planning for success   [name] has agreed to follow up with our clinic in 2 weeks. She was informed of the importance of frequent follow up visits to maximize her success with intensive lifestyle modifications for her multiple health conditions.  ALLERGIES: Allergies  Allergen Reactions  . Sulfa Antibiotics Rash    MEDICATIONS: Current Outpatient Medications on File Prior to Visit  Medication Sig Dispense Refill  . Cetirizine HCl (ZYRTEC ALLERGY) 10 MG CAPS Take 1 capsule by mouth daily.    . Cranberry 1000 MG CAPS Take 1 capsule by mouth daily.    [name] ibuprofen (ADVIL,MOTRIN) 100 MG tablet Take 100 mg by mouth every 6 (six) hours as needed for fever.    [name] 1000 MG CAPS Take 1 capsule by mouth daily.    [name] levonorgestrel-ethinyl estradiol (AVIANE) 0.1-20 MG-MCG tablet Take 1 tablet by mouth daily.    . Probiotic Product (PROBIOTIC-10 PO) Take 1 capsule by mouth daily.    . pseudoephedrine (SUDAFED) 30 MG tablet Take 30 mg by mouth every 4 (four) hours as needed for congestion.    . Vitamin D, Ergocalciferol, (DRISDOL) 1.25 MG (50000 UT) CAPS capsule Take 1 capsule (50,000 Units total) by mouth every 7 (seven) days. 4 capsule 0   No current facility-administered medications on file prior to visit.     PAST MEDICAL HISTORY: Past Medical History:  Diagnosis Date  . Back pain   . Joint pain   . Kidney problem   . Swallowing difficulty     PAST SURGICAL HISTORY: Past Surgical History:  Procedure Laterality Date  . CESAREAN SECTION     x2    SOCIAL HISTORY: Social History   Tobacco Use  . Smoking status: Never Smoker  . Smokeless tobacco: Never Used  Substance Use Topics  . Alcohol use: Not Currently  . Drug use: Never     FAMILY HISTORY: History reviewed. No pertinent family history.  ROS: Review of Systems  Constitutional: Negative for weight loss.  Psychiatric/Behavioral: Positive for depression. Negative for suicidal ideas.    PHYSICAL EXAM: Pt in no acute distress  RECENT LABS AND TESTS: BMET    Component Value Date/Time   NA 146 (H) 09/08/2018 1207   K 4.8 09/08/2018 1207   CL 106 09/08/2018 1207   CO2 22 09/08/2018 1207   GLUCOSE 86 09/08/2018 1207   BUN 10 09/08/2018 1207   CREATININE 0.69 09/08/2018 1207   CALCIUM 9.2 09/08/2018 1207   GFRNONAA 108 09/08/2018 1207   GFRAA 125 09/08/2018 1207   Lab Results  Component Value Date   HGBA1C 5.3 09/08/2018   Lab Results  Component Value Date   INSULIN 18.7 09/08/2018   CBC    Component Value Date/Time   WBC 10.0 09/08/2018 1207   WBC 8.9 07/07/2008 0505   RBC 4.71 09/08/2018 1207   RBC 3.90 07/07/2008 0505   HGB 13.4 09/08/2018 1207   HCT 40.6  09/08/2018 1207   PLT 254 07/07/2008 0505   MCV 86 09/08/2018 1207   MCH 28.5 09/08/2018 1207   MCHC 33.0 09/08/2018 1207   MCHC 34.2 07/07/2008 0505   RDW 12.1 09/08/2018 1207   LYMPHSABS 2.0 09/08/2018 1207   EOSABS 0.1 09/08/2018 1207   BASOSABS 0.0 09/08/2018 1207   Iron/TIBC/Ferritin/ %Sat No results found for: IRON, TIBC, FERRITIN, IRONPCTSAT Lipid Panel     Component Value Date/Time   CHOL 223 (H) 09/08/2018 1207   TRIG 82 09/08/2018 1207   HDL 77 09/08/2018 1207   LDLCALC 130 (H) 09/08/2018 1207   Hepatic Function Panel     Component Value Date/Time   PROT 7.2 09/08/2018 1207   ALBUMIN 4.2 09/08/2018 1207   AST 14 09/08/2018 1207   ALT 16 09/08/2018 1207   ALKPHOS 90 09/08/2018 1207   BILITOT <0.2 09/08/2018 1207      Component Value Date/Time   TSH 0.640 09/08/2018 1207      I, Burt KnackSharon Martin, am acting as transcriptionist for Debbra RidingAlexandria Kadolph, MD  I have reviewed the above documentation for accuracy and completeness, and I agree with the above. -  Debbra RidingAlexandria Kadolph, MD

## 2019-01-12 ENCOUNTER — Encounter (INDEPENDENT_AMBULATORY_CARE_PROVIDER_SITE_OTHER): Payer: Self-pay | Admitting: Family Medicine

## 2019-01-12 ENCOUNTER — Other Ambulatory Visit: Payer: Self-pay

## 2019-01-12 ENCOUNTER — Telehealth (INDEPENDENT_AMBULATORY_CARE_PROVIDER_SITE_OTHER): Payer: BC Managed Care – PPO | Admitting: Family Medicine

## 2019-01-12 DIAGNOSIS — Z6837 Body mass index (BMI) 37.0-37.9, adult: Secondary | ICD-10-CM | POA: Diagnosis not present

## 2019-01-12 DIAGNOSIS — F3289 Other specified depressive episodes: Secondary | ICD-10-CM

## 2019-01-12 DIAGNOSIS — E559 Vitamin D deficiency, unspecified: Secondary | ICD-10-CM | POA: Diagnosis not present

## 2019-01-12 MED ORDER — BUPROPION HCL ER (SR) 150 MG PO TB12: 150.0000 mg | ORAL_TABLET | Freq: Every day | ORAL | 0 refills | Status: DC

## 2019-01-12 MED ORDER — VITAMIN D (ERGOCALCIFEROL) 1.25 MG (50000 UNIT) PO CAPS: 50000.0000 [IU] | ORAL_CAPSULE | ORAL | 0 refills | Status: DC

## 2019-01-13 NOTE — Progress Notes (Signed)
Office: [phone]  /  Fax: [phone] TeleHealth Visit:  Christy PinaHeather J Fitzgerald has verbally consented to this TeleHealth visit today. The patient is located at home, the provider is located at the [facility]. The participants in this visit include the listed provider and patient. The visit was conducted today via face time.  HPI:   Chief Complaint: OBESITY [name] is here to discuss her progress with her obesity treatment plan. She is on the Category 3 plan and is following her eating plan approximately 0 % of the time (unsure). She states she is walking for 20-30 minutes 2-3 times per week. [name] is currently at home. She is following the plan most of the time struggling to eat the greek yogurt, secondary to losing interest. She denies hunger, cravings, or difficulty finding any of the food on the plan.  We were unable to weigh the patient today for this TeleHealth visit. She feels as if she has lost 2 lbs since her last visit. She has lost 11 lbs since starting treatment with us.  Vitamin D Deficiency [name] has a diagnosis of vitamin D deficiency. She is currently taking prescription Vit D. She notes fatigue and denies nausea, vomiting or muscle weakness.  Depression with Emotional Eating Behaviors [name] denies side effects of Wellbutrin and notes her symptoms are better controlled. [name] struggles with emotional eating and using food for comfort to the extent that it is negatively impacting her health. She often snacks when she is not hungry. [name] sometimes feels she is out of control and then feels guilty that she made poor food choices. She has been working on behavior modification techniques to help reduce her emotional eating and has been somewhat successful. She shows no sign of suicidal or homicidal ideations.  Dep[facility] screen [facility] 09/08/2018  Decreased Interest 2  Down, Depressed, Hopeless 1  PHQ - 2 Score 3  Altered sleeping 0  Tired,  decreased energy 3  Change in appetite 2  Feeling bad or failure about yourself  1  Trouble concentrating 1  Moving slowly or fidgety/restless 0  Suicidal thoughts 0  PHQ-9 Score 10  Difficult doing work/chores Not difficult at all     ASSESSMENT AND PLAN:  Vitamin D deficiency - Plan: Vitamin D, Ergocalciferol, (DRISDOL) 1.25 MG (50000 UT) CAPS capsule  Other depression - Plan: buPROPion (WELLBUTRIN SR) 150 MG 12 hr tablet  Class 2 severe obesity with serious comorbidity and body mass index (BMI) of 37.0 to 37.9 in adult, unspecified obesity type (HCC)  Other depression - with emotional eating - Plan: buPROPion (WELLBUTRIN SR) 150 MG 12 hr tablet  PLAN:  Vitamin D Deficiency [name] was informed that low vitamin D levels contributes to fatigue and are associated with obesity, breast, and colon cancer. [name] agrees to continue taking prescription Vit D 50,000 IU every week #4 and we will refill for 1 month. She will follow up for routine testing of vitamin D, at least 2-3 times per year. She was informed of the risk of over-replacement of vitamin D and agrees to not increase her dose unless she discusses this with us first. We will repeat labs at next appointment. [name] agrees to follow up with our clinic in 3 weeks.  Depression with Emotional Eating Behaviors We discussed behavior modification techniques today to help [name] deal with her emotional eating and depression. [name] agrees to continue taking Wellbutrin SR 150 mg PO daily #30 and we will refill for 1 month. [name] agrees  to follow up with our clinic in 3 weeks.  Obesity [name] is currently in the action stage of change. As such, her goal is to continue with weight loss efforts She has agreed to follow the Category 3 plan [name] has been instructed to work up to a goal of 150 minutes of combined cardio and strengthening exercise per week for weight loss and overall health benefits. We discussed the following  Behavioral Modification Strategies today: increasing lean protein intake, increasing vegetables and work on meal planning and easy cooking plans, keeping healthy foods in the home, better snacking choices, and planning for success   [name] has agreed to follow up with our clinic in 3 weeks. She was informed of the importance of frequent follow up visits to maximize her success with intensive lifestyle modifications for her multiple health conditions.  ALLERGIES: Allergies  Allergen Reactions  . Sulfa Antibiotics Rash    MEDICATIONS: Current Outpatient Medications on File Prior to Visit  Medication Sig Dispense Refill  . Cetirizine HCl (ZYRTEC ALLERGY) 10 MG CAPS Take 1 capsule by mouth daily.    . Cranberry 1000 MG CAPS Take 1 capsule by mouth daily.    [name]. ibuprofen (ADVIL,MOTRIN) 100 MG tablet Take 100 mg by mouth every 6 (six) hours as needed for fever.    [name]. Krill Oil 1000 MG CAPS Take 1 capsule by mouth daily.    [name]. levonorgestrel-ethinyl estradiol (AVIANE) 0.1-20 MG-MCG tablet Take 1 tablet by mouth daily.    . Probiotic Product (PROBIOTIC-10 PO) Take 1 capsule by mouth daily.    . pseudoephedrine (SUDAFED) 30 MG tablet Take 30 mg by mouth every 4 (four) hours as needed for congestion.     No current facility-administered medications on file prior to visit.     PAST MEDICAL HISTORY: Past Medical History:  Diagnosis Date  . Back pain   . Joint pain   . Kidney problem   . Swallowing difficulty     PAST SURGICAL HISTORY: Past Surgical History:  Procedure Laterality Date  . CESAREAN SECTION     x2    SOCIAL HISTORY: Social History   Tobacco Use  . Smoking status: Never Smoker  . Smokeless tobacco: Never Used  Substance Use Topics  . Alcohol use: Not Currently  . Drug use: Never    FAMILY HISTORY: History reviewed. No pertinent family history.  ROS: Review of Systems  Constitutional: Positive for malaise/fatigue and weight loss.  Gastrointestinal: Negative for  nausea and vomiting.  Musculoskeletal:       Negative muscle weakness  Psychiatric/Behavioral: Positive for depression. Negative for suicidal ideas.    PHYSICAL EXAM: Pt in no acute distress  RECENT LABS AND TESTS: BMET    Component Value Date/Time   NA 146 (H) 09/08/2018 1207   K 4.8 09/08/2018 1207   CL 106 09/08/2018 1207   CO2 22 09/08/2018 1207   GLUCOSE 86 09/08/2018 1207   BUN 10 09/08/2018 1207   CREATININE 0.69 09/08/2018 1207   CALCIUM 9.2 09/08/2018 1207   GFRNONAA 108 09/08/2018 1207   GFRAA 125 09/08/2018 1207   Lab Results  Component Value Date   HGBA1C 5.3 09/08/2018   Lab Results  Component Value Date   INSULIN 18.7 09/08/2018   CBC    Component Value Date/Time   WBC 10.0 09/08/2018 1207   WBC 8.9 07/07/2008 0505   RBC 4.71 09/08/2018 1207   RBC 3.90 07/07/2008 0505   HGB 13.4 09/08/2018 1207   HCT 40.6 09/08/2018  1207   PLT 254 07/07/2008 0505   MCV 86 09/08/2018 1207   MCH 28.5 09/08/2018 1207   MCHC 33.0 09/08/2018 1207   MCHC 34.2 07/07/2008 0505   RDW 12.1 09/08/2018 1207   LYMPHSABS 2.0 09/08/2018 1207   EOSABS 0.1 09/08/2018 1207   BASOSABS 0.0 09/08/2018 1207   Iron/TIBC/Ferritin/ %Sat No results found for: IRON, TIBC, FERRITIN, IRONPCTSAT Lipid Panel     Component Value Date/Time   CHOL 223 (H) 09/08/2018 1207   TRIG 82 09/08/2018 1207   HDL 77 09/08/2018 1207   LDLCALC 130 (H) 09/08/2018 1207   Hepatic Function Panel     Component Value Date/Time   PROT 7.2 09/08/2018 1207   ALBUMIN 4.2 09/08/2018 1207   AST 14 09/08/2018 1207   ALT 16 09/08/2018 1207   ALKPHOS 90 09/08/2018 1207   BILITOT <0.2 09/08/2018 1207      Component Value Date/Time   TSH 0.640 09/08/2018 1207      I, Trixie Dredge, am acting as transcriptionist for Ilene Qua, MD  I have reviewed the above documentation for accuracy and completeness, and I agree with the above. - Ilene Qua, MD

## 2019-02-02 ENCOUNTER — Ambulatory Visit (INDEPENDENT_AMBULATORY_CARE_PROVIDER_SITE_OTHER): Payer: BC Managed Care – PPO | Admitting: Family Medicine

## 2019-02-02 ENCOUNTER — Encounter (INDEPENDENT_AMBULATORY_CARE_PROVIDER_SITE_OTHER): Payer: Self-pay | Admitting: Family Medicine

## 2019-02-02 DIAGNOSIS — E7849 Other hyperlipidemia: Secondary | ICD-10-CM | POA: Diagnosis not present

## 2019-02-02 DIAGNOSIS — E8881 Metabolic syndrome: Secondary | ICD-10-CM | POA: Diagnosis not present

## 2019-02-02 DIAGNOSIS — Z6837 Body mass index (BMI) 37.0-37.9, adult: Secondary | ICD-10-CM

## 2019-02-03 NOTE — Progress Notes (Signed)
Office: [phone]  /  Fax: [phone] TeleHealth Visit:  [name] has verbally consented to this TeleHealth visit today. The patient is located at home, the provider is located at the [facility] office. The participants in this visit include the listed provider and patient and any and all parties involved. The visit was conducted today via FaceTime.  HPI:   Chief Complaint: OBESITY [name] is here to discuss her progress with her obesity treatment plan. She is on the Category 3 plan and is following her eating plan approximately 75 % of the time. She states she is walking 20 minutes 2 to 3 times per week, [place] [name] is not hungry. [place], and she didn't find it hard to follow the plan with some alterations. [name] does have plan to fly within the next two weeks and she is going to [place] for baseball tournament. We were unable to weigh the patient today for this TeleHealth visit. She feels as if she has lost weight since her last visit. She has lost 19 lbs since starting treatment with us.  Hyperlipidemia [name] has hyperlipidemia. She has LDL of 130 and HDL of 77, and she is not on statin. [name] has been trying to improve her cholesterol levels with intensive lifestyle modification including a low saturated fat diet, exercise and weight loss. She denies any chest pain or myalgias.  Insulin Resistance [name] has a diagnosis of insulin resistance based on her elevated fasting insulin level >5. Although Phylicia's blood glucose readings are still under good control, insulin resistance puts her at greater risk of metabolic syndrome and diabetes. She is not on metformin currently and continues to work on diet and exercise to decrease risk of diabetes. [name] has minimal carb cravings.  ASSESSMENT AND PLAN:  Other hyperlipidemia  Insulin resistance  Class 2 severe obesity with serious  comorbidity and body mass index (BMI) of 37.0 to 37.9 in adult, unspecified obesity type (HCC)  PLAN:  Hyperlipidemia [name] was informed of the American Heart Association Guidelines emphasizing intensive lifestyle modifications as the first line treatment for hyperlipidemia. We discussed many lifestyle modifications today in depth, and [name] will continue to work on decreasing saturated fats such as fatty red meat, butter and many fried foods. She will also increase vegetables and lean protein in her diet and continue to work on exercise and weight loss efforts. We will check fasting lipid panel at the next appointment.  Insulin Resistance [name] will continue to work on weight loss, exercise, and decreasing simple carbohydrates in her diet to help decrease the risk of diabetes. She was informed that eating too many simple carbohydrates or too many calories at one sitting increases the likelihood of GI side effects. We will check labs at the next appointment. [name] agreed to follow up with us as directed to monitor her progress.  Obesity [name] is currently in the action stage of change. As such, her goal is to continue with weight loss efforts She has agreed to follow the Category 3 plan [name] has been instructed to work up to a goal of 150 minutes of combined cardio and strengthening exercise per week for weight loss and overall health benefits. We discussed the following Behavioral Modification Strategies today: planning for success, keeping healthy foods in the home, better snacking choices, increasing lean protein intake, increasing vegetables and work on meal planning and easy cooking plans  [name] has agreed  to follow up with our clinic in 2 weeks. She was informed of the importance of frequent follow up visits to maximize her success with intensive lifestyle modifications for her multiple health conditions.  ALLERGIES: Allergies  Allergen Reactions  . Sulfa Antibiotics Rash     MEDICATIONS: Current Outpatient Medications on File Prior to Visit  Medication Sig Dispense Refill  . buPROPion (WELLBUTRIN SR) 150 MG 12 hr tablet Take 1 tablet (150 mg total) by mouth daily. 30 tablet 0  . Cetirizine HCl (ZYRTEC ALLERGY) 10 MG CAPS Take 1 capsule by mouth daily.    . Cranberry 1000 MG CAPS Take 1 capsule by mouth daily.    [name]. ibuprofen (ADVIL,MOTRIN) 100 MG tablet Take 100 mg by mouth every 6 (six) hours as needed for fever.    [name]. Krill Oil 1000 MG CAPS Take 1 capsule by mouth daily.    [name]. levonorgestrel-ethinyl estradiol (AVIANE) 0.1-20 MG-MCG tablet Take 1 tablet by mouth daily.    . Probiotic Product (PROBIOTIC-10 PO) Take 1 capsule by mouth daily.    . pseudoephedrine (SUDAFED) 30 MG tablet Take 30 mg by mouth every 4 (four) hours as needed for congestion.    . Vitamin D, Ergocalciferol, (DRISDOL) 1.25 MG (50000 UT) CAPS capsule Take 1 capsule (50,000 Units total) by mouth every 7 (seven) days. 4 capsule 0   No current facility-administered medications on file prior to visit.     PAST MEDICAL HISTORY: Past Medical History:  Diagnosis Date  . Back pain   . Joint pain   . Kidney problem   . Swallowing difficulty     PAST SURGICAL HISTORY: Past Surgical History:  Procedure Laterality Date  . CESAREAN SECTION     x2    SOCIAL HISTORY: Social History   Tobacco Use  . Smoking status: Never Smoker  . Smokeless tobacco: Never Used  Substance Use Topics  . Alcohol use: Not Currently  . Drug use: Never    FAMILY HISTORY: History reviewed. No pertinent family history.  ROS: Review of Systems  Constitutional: Positive for weight loss.  Cardiovascular: Negative for chest pain.  Musculoskeletal: Negative for myalgias.  Endo/Heme/Allergies:       Positive for carb cravings    PHYSICAL EXAM: Pt in no acute distress  RECENT LABS AND TESTS: BMET    Component Value Date/Time   NA 146 (H) 09/08/2018 1207   K 4.8 09/08/2018 1207   CL 106 09/08/2018  1207   CO2 22 09/08/2018 1207   GLUCOSE 86 09/08/2018 1207   BUN 10 09/08/2018 1207   CREATININE 0.69 09/08/2018 1207   CALCIUM 9.2 09/08/2018 1207   GFRNONAA 108 09/08/2018 1207   GFRAA 125 09/08/2018 1207   Lab Results  Component Value Date   HGBA1C 5.3 09/08/2018   Lab Results  Component Value Date   INSULIN 18.7 09/08/2018   CBC    Component Value Date/Time   WBC 10.0 09/08/2018 1207   WBC 8.9 07/07/2008 0505   RBC 4.71 09/08/2018 1207   RBC 3.90 07/07/2008 0505   HGB 13.4 09/08/2018 1207   HCT 40.6 09/08/2018 1207   PLT 254 07/07/2008 0505   MCV 86 09/08/2018 1207   MCH 28.5 09/08/2018 1207   MCHC 33.0 09/08/2018 1207   MCHC 34.2 07/07/2008 0505   RDW 12.1 09/08/2018 1207   LYMPHSABS 2.0 09/08/2018 1207   EOSABS 0.1 09/08/2018 1207   BASOSABS 0.0 09/08/2018 1207   Iron/TIBC/Ferritin/ %Sat No results found for: IRON, TIBC, FERRITIN,  IRONPCTSAT Lipid Panel     Component Value Date/Time   CHOL 223 (H) 09/08/2018 1207   TRIG 82 09/08/2018 1207   HDL 77 09/08/2018 1207   LDLCALC 130 (H) 09/08/2018 1207   Hepatic Function Panel     Component Value Date/Time   PROT 7.2 09/08/2018 1207   ALBUMIN 4.2 09/08/2018 1207   AST 14 09/08/2018 1207   ALT 16 09/08/2018 1207   ALKPHOS 90 09/08/2018 1207   BILITOT <0.2 09/08/2018 1207      Component Value Date/Time   TSH 0.640 09/08/2018 1207     Ref. Range 09/08/2018 12:07  Vitamin D, 25-Hydroxy Latest Ref Range: 30.0 - 100.0 ng/mL 35.9    I, Doreene Nest, am acting as Location manager for Eber Jones, MD  I have reviewed the above documentation for accuracy and completeness, and I agree with the above. - Ilene Qua, MD

## 2019-02-16 ENCOUNTER — Other Ambulatory Visit: Payer: Self-pay

## 2019-02-16 ENCOUNTER — Ambulatory Visit (INDEPENDENT_AMBULATORY_CARE_PROVIDER_SITE_OTHER): Payer: BC Managed Care – PPO | Admitting: Family Medicine

## 2019-02-16 ENCOUNTER — Encounter (INDEPENDENT_AMBULATORY_CARE_PROVIDER_SITE_OTHER): Payer: Self-pay | Admitting: Family Medicine

## 2019-02-16 DIAGNOSIS — E559 Vitamin D deficiency, unspecified: Secondary | ICD-10-CM | POA: Diagnosis not present

## 2019-02-16 DIAGNOSIS — Z6837 Body mass index (BMI) 37.0-37.9, adult: Secondary | ICD-10-CM | POA: Diagnosis not present

## 2019-02-16 DIAGNOSIS — F3289 Other specified depressive episodes: Secondary | ICD-10-CM | POA: Diagnosis not present

## 2019-02-16 MED ORDER — VITAMIN D (ERGOCALCIFEROL) 1.25 MG (50000 UNIT) PO CAPS: 50000.0000 [IU] | ORAL_CAPSULE | ORAL | 0 refills | Status: DC

## 2019-02-16 MED ORDER — BUPROPION HCL ER (SR) 150 MG PO TB12: 150.0000 mg | ORAL_TABLET | Freq: Every day | ORAL | 0 refills | Status: DC

## 2019-02-16 NOTE — Progress Notes (Signed)
Office: [phone]  /  Fax: [phone] TeleHealth Visit:  Christy PinaHeather J Fitzgerald has verbally consented to this TeleHealth visit today. The patient is located at home, the provider is located at the [facility] office. The participants in this visit include the listed provider and patient. The visit was conducted today via face time.  HPI:   Chief Complaint: OBESITY [name] is here to discuss her progress with her obesity treatment plan. She is on the Category 3 plan and is following her eating plan approximately 75 % of the time. She states she is walking for 30 minutes 3 times per week. [name] returned from [place] yesterday for baseball tournament. The next few weeks she is off this week from work, then returning to work full time. She states she has lost weight from 215 lbs and now at 211 lbs today. We were unable to weigh the patient today for this TeleHealth visit. She feels as if she has lost 4 lbs since her last visit. She has lost 19 lbs since starting treatment with us.  Vitamin D Deficiency [name] has a diagnosis of vitamin D deficiency. She is currently taking prescription Vit D. She notes fatigue and denies nausea, vomiting or muscle weakness.  Depression with Emotional Eating Behaviors [name] notes her symptoms are better controlled on bupropion. [name] struggles with emotional eating and using food for comfort to the extent that it is negatively impacting her health. She often snacks when she is not hungry. [name] sometimes feels she is out of control and then feels guilty that she made poor food choices. She has been working on behavior modification techniques to help reduce her emotional eating and has been somewhat successful. She shows no sign of suicidal or homicidal ideations.  [facility]  Decreased Interest 2  Down, Depressed, Hopeless 1  PHQ - 2 Score 3  Altered sleeping 0  Tired, decreased energy 3  Change in appetite 2   Feeling bad or failure about yourself  1  Trouble concentrating 1  Moving slowly or fidgety/restless 0  Suicidal thoughts 0  PHQ-9 Score 10  Difficult doing work/chores Not difficult at all    ASSESSMENT AND PLAN:  Class 2 severe obesity with serious comorbidity and body mass index (BMI) of 37.0 to 37.9 in adult, unspecified obesity type (HCC)  Vitamin D deficiency - Plan: Vitamin D, Ergocalciferol, (DRISDOL) 1.25 MG (50000 UT) CAPS capsule  Other depression - Plan: buPROPion (WELLBUTRIN SR) 150 MG 12 hr tablet  PLAN:  Vitamin D Deficiency [name] was informed that low vitamin D levels contributes to fatigue and are associated with obesity, breast, and colon cancer. [name] agrees to continue taking prescription Vit D 50,000 IU every week #4 and we will refill for 1 month. She will follow up for routine testing of vitamin D, at least 2-3 times per year. She was informed of the risk of over-replacement of vitamin D and agrees to not increase her dose unless she discusses this with us first. [name] agrees to follow up with our clinic in 2 weeks.  Depression with Emotional Eating Behaviors We discussed behavior modification techniques today to help [name] deal with her emotional eating and depression. [name] agrees to continue taking Wellbutrin SR 150 mg PO daily #30 and we will refill for 1 month. [name] agrees to follow up with our clinic in 2 weeks.  Obesity [name] is currently in the action stage of change. As such, her goal is to continue with weight  loss efforts She has agreed to follow the Category 3 plan [name] has been instructed to work up to a goal of 150 minutes of combined cardio and strengthening exercise per week or continue walking for weight loss and overall health benefits. We discussed the following Behavioral Modification Strategies today: increasing lean protein intake, increasing vegetables and work on meal planning and easy cooking plans, keeping healthy foods  in the home, and planning for success   [name] has agreed to follow up with our clinic in 2 weeks. She was informed of the importance of frequent follow up visits to maximize her success with intensive lifestyle modifications for her multiple health conditions.  ALLERGIES: Allergies  Allergen Reactions  . Sulfa Antibiotics Rash    MEDICATIONS: Current Outpatient Medications on File Prior to Visit  Medication Sig Dispense Refill  . Cetirizine HCl (ZYRTEC ALLERGY) 10 MG CAPS Take 1 capsule by mouth daily.    . Cranberry 1000 MG CAPS Take 1 capsule by mouth daily.    [name]. ibuprofen (ADVIL,MOTRIN) 100 MG tablet Take 100 mg by mouth every 6 (six) hours as needed for fever.    [name]. Krill Oil 1000 MG CAPS Take 1 capsule by mouth daily.    [name]. levonorgestrel-ethinyl estradiol (AVIANE) 0.1-20 MG-MCG tablet Take 1 tablet by mouth daily.    . Probiotic Product (PROBIOTIC-10 PO) Take 1 capsule by mouth daily.    . pseudoephedrine (SUDAFED) 30 MG tablet Take 30 mg by mouth every 4 (four) hours as needed for congestion.     No current facility-administered medications on file prior to visit.     PAST MEDICAL HISTORY: Past Medical History:  Diagnosis Date  . Back pain   . Joint pain   . Kidney problem   . Swallowing difficulty     PAST SURGICAL HISTORY: Past Surgical History:  Procedure Laterality Date  . CESAREAN SECTION     x2    SOCIAL HISTORY: Social History   Tobacco Use  . Smoking status: Never Smoker  . Smokeless tobacco: Never Used  Substance Use Topics  . Alcohol use: Not Currently  . Drug use: Never    FAMILY HISTORY: History reviewed. No pertinent family history.  ROS: Review of Systems  Constitutional: Positive for malaise/fatigue and weight loss.  Gastrointestinal: Negative for nausea and vomiting.  Musculoskeletal:       Negative muscle weakness  Psychiatric/Behavioral: Positive for depression. Negative for suicidal ideas.    PHYSICAL EXAM: Pt in no acute  distress  RECENT LABS AND TESTS: BMET    Component Value Date/Time   NA 146 (H) 09/08/2018 1207   K 4.8 09/08/2018 1207   CL 106 09/08/2018 1207   CO2 22 09/08/2018 1207   GLUCOSE 86 09/08/2018 1207   BUN 10 09/08/2018 1207   CREATININE 0.69 09/08/2018 1207   CALCIUM 9.2 09/08/2018 1207   GFRNONAA 108 09/08/2018 1207   GFRAA 125 09/08/2018 1207   Lab Results  Component Value Date   HGBA1C 5.3 09/08/2018   Lab Results  Component Value Date   INSULIN 18.7 09/08/2018   CBC    Component Value Date/Time   WBC 10.0 09/08/2018 1207   WBC 8.9 07/07/2008 0505   RBC 4.71 09/08/2018 1207   RBC 3.90 07/07/2008 0505   HGB 13.4 09/08/2018 1207   HCT 40.6 09/08/2018 1207   PLT 254 07/07/2008 0505   MCV 86 09/08/2018 1207   MCH 28.5 09/08/2018 1207   MCHC 33.0 09/08/2018 1207   MCHC 34.2  07/07/2008 0505   RDW 12.1 09/08/2018 1207   LYMPHSABS 2.0 09/08/2018 1207   EOSABS 0.1 09/08/2018 1207   BASOSABS 0.0 09/08/2018 1207   Iron/TIBC/Ferritin/ %Sat No results found for: IRON, TIBC, FERRITIN, IRONPCTSAT Lipid Panel     Component Value Date/Time   CHOL 223 (H) 09/08/2018 1207   TRIG 82 09/08/2018 1207   HDL 77 09/08/2018 1207   LDLCALC 130 (H) 09/08/2018 1207   Hepatic Function Panel     Component Value Date/Time   PROT 7.2 09/08/2018 1207   ALBUMIN 4.2 09/08/2018 1207   AST 14 09/08/2018 1207   ALT 16 09/08/2018 1207   ALKPHOS 90 09/08/2018 1207   BILITOT <0.2 09/08/2018 1207      Component Value Date/Time   TSH 0.640 09/08/2018 1207      I, Trixie Dredge, am acting as transcriptionist for Ilene Qua, MD   I have reviewed the above documentation for accuracy and completeness, and I agree with the above. - Ilene Qua, MD

## 2019-02-17 ENCOUNTER — Encounter (INDEPENDENT_AMBULATORY_CARE_PROVIDER_SITE_OTHER): Payer: Self-pay | Admitting: Family Medicine

## 2019-03-03 ENCOUNTER — Ambulatory Visit (INDEPENDENT_AMBULATORY_CARE_PROVIDER_SITE_OTHER): Payer: BC Managed Care – PPO | Admitting: Family Medicine

## 2019-03-03 ENCOUNTER — Other Ambulatory Visit: Payer: Self-pay

## 2019-03-03 ENCOUNTER — Encounter (INDEPENDENT_AMBULATORY_CARE_PROVIDER_SITE_OTHER): Payer: Self-pay | Admitting: Family Medicine

## 2019-03-03 DIAGNOSIS — Z6837 Body mass index (BMI) 37.0-37.9, adult: Secondary | ICD-10-CM

## 2019-03-03 DIAGNOSIS — E538 Deficiency of other specified B group vitamins: Secondary | ICD-10-CM | POA: Diagnosis not present

## 2019-03-03 DIAGNOSIS — E7849 Other hyperlipidemia: Secondary | ICD-10-CM

## 2019-03-10 NOTE — Progress Notes (Signed)
Office: [phone]  /  Fax: [phone] TeleHealth Visit:  Christy PinaHeather J Fitzgerald has verbally consented to this TeleHealth visit today. The patient is located at home, the provider is located at the [facility] office. The participants in this visit include the listed provider and patient. The visit was conducted today via face time.  HPI:   Chief Complaint: OBESITY [name] is here to discuss her progress with her obesity treatment plan. She is on the Category 3 plan and is following her eating plan approximately 50 % of the time. She states she is walking for 30 minutes 3 times per week. [name] reports she struggled with following her eating plan over the past few weeks, as once she and her family returned from vacation, birthday treats were made for her. This week has been stressful with the start of remote school. Last report weight was 211 lbs. We were unable to weigh the patient today for this TeleHealth visit. She feels as if she has maintained her weight since her last visit. She has lost 19 lbs since starting treatment with us.  Hyperlipidemia [name] has hyperlipidemia and has been trying to improve her cholesterol levels with intensive lifestyle modification including a low saturated fat diet, exercise and weight loss. Last LDL was of 130 and she is not on statin. She denies any chest pain, claudication or myalgias.  Vitamin B12 Deficiency [name] has a diagnosis of B12 insufficiency and notes fatigue. She is not on B12 supplementation. She is not a vegetarian and does not have a previous diagnosis of pernicious anemia. She does not have a history of weight loss surgery.   ASSESSMENT AND PLAN:  Other hyperlipidemia  B12 nutritional deficiency  Class 2 severe obesity with serious comorbidity and body mass index (BMI) of 37.0 to 37.9 in adult, unspecified obesity type (HCC)  PLAN:  Hyperlipidemia [name] was informed of the American Heart Association  Guidelines emphasizing intensive lifestyle modifications as the first line treatment for hyperlipidemia. We discussed many lifestyle modifications today in depth, and [name] will continue to work on decreasing saturated fats such as fatty red meat, butter and many fried foods. She will also increase vegetables and lean protein in her diet and continue to work on exercise and weight loss efforts. We will repeat FLP at her first in office appointment. [name] agrees to follow up with our clinic in 2 weeks.  Vitamin B12 Deficiency [name] will continue Category 3 plan, and will work on increasing B12 rich foods in her diet. B12 supplementation was not prescribed today. We will repeat B12 level at her first in office appointment. [name] agrees to follow up with our clinic in 2 weeks.  Obesity [name] is currently in the action stage of change. As such, her goal is to continue with weight loss efforts She has agreed to follow the Category 3 plan [name] has been instructed to work up to a goal of 150 minutes of combined cardio and strengthening exercise per week for weight loss and overall health benefits. We discussed the following Behavioral Modification Strategies today: increasing lean protein intake, increasing vegetables and work on meal planning and easy cooking plans, keeping healthy foods in the home, and planning for success   [name] has agreed to follow up with our clinic in 2 weeks. She was informed of the importance of frequent follow up visits to maximize her success with intensive lifestyle modifications for her multiple health conditions.  ALLERGIES: Allergies  Allergen Reactions  . Sulfa Antibiotics  Rash    MEDICATIONS: Current Outpatient Medications on File Prior to Visit  Medication Sig Dispense Refill  . buPROPion (WELLBUTRIN SR) 150 MG 12 hr tablet Take 1 tablet (150 mg total) by mouth daily. 30 tablet 0  . Cetirizine HCl (ZYRTEC ALLERGY) 10 MG CAPS Take 1 capsule by mouth  daily.    . Cranberry 1000 MG CAPS Take 1 capsule by mouth daily.    [name] ibuprofen (ADVIL,MOTRIN) 100 MG tablet Take 100 mg by mouth every 6 (six) hours as needed for fever.    [name] Oil 1000 MG CAPS Take 1 capsule by mouth daily.    [name] levonorgestrel-ethinyl estradiol (AVIANE) 0.1-20 MG-MCG tablet Take 1 tablet by mouth daily.    . Probiotic Product (PROBIOTIC-10 PO) Take 1 capsule by mouth daily.    . pseudoephedrine (SUDAFED) 30 MG tablet Take 30 mg by mouth every 4 (four) hours as needed for congestion.    . Vitamin D, Ergocalciferol, (DRISDOL) 1.25 MG (50000 UT) CAPS capsule Take 1 capsule (50,000 Units total) by mouth every 7 (seven) days. 4 capsule 0   No current facility-administered medications on file prior to visit.     PAST MEDICAL HISTORY: Past Medical History:  Diagnosis Date  . Back pain   . Joint pain   . Kidney problem   . Swallowing difficulty     PAST SURGICAL HISTORY: Past Surgical History:  Procedure Laterality Date  . CESAREAN SECTION     x2    SOCIAL HISTORY: Social History   Tobacco Use  . Smoking status: Never Smoker  . Smokeless tobacco: Never Used  Substance Use Topics  . Alcohol use: Not Currently  . Drug use: Never    FAMILY HISTORY: History reviewed. No pertinent family history.  ROS: Review of Systems  Constitutional: Positive for malaise/fatigue. Negative for weight loss.  Cardiovascular: Negative for chest pain and claudication.  Musculoskeletal: Negative for myalgias.    PHYSICAL EXAM: Pt in no acute distress  RECENT LABS AND TESTS: BMET    Component Value Date/Time   NA 146 (H) 09/08/2018 1207   K 4.8 09/08/2018 1207   CL 106 09/08/2018 1207   CO2 22 09/08/2018 1207   GLUCOSE 86 09/08/2018 1207   BUN 10 09/08/2018 1207   CREATININE 0.69 09/08/2018 1207   CALCIUM 9.2 09/08/2018 1207   GFRNONAA 108 09/08/2018 1207   GFRAA 125 09/08/2018 1207   Lab Results  Component Value Date   HGBA1C 5.3 09/08/2018   Lab Results   Component Value Date   INSULIN 18.7 09/08/2018   CBC    Component Value Date/Time   WBC 10.0 09/08/2018 1207   WBC 8.9 07/07/2008 0505   RBC 4.71 09/08/2018 1207   RBC 3.90 07/07/2008 0505   HGB 13.4 09/08/2018 1207   HCT 40.6 09/08/2018 1207   PLT 254 07/07/2008 0505   MCV 86 09/08/2018 1207   MCH 28.5 09/08/2018 1207   MCHC 33.0 09/08/2018 1207   MCHC 34.2 07/07/2008 0505   RDW 12.1 09/08/2018 1207   LYMPHSABS 2.0 09/08/2018 1207   EOSABS 0.1 09/08/2018 1207   BASOSABS 0.0 09/08/2018 1207   Iron/TIBC/Ferritin/ %Sat No results found for: IRON, TIBC, FERRITIN, IRONPCTSAT Lipid Panel     Component Value Date/Time   CHOL 223 (H) 09/08/2018 1207   TRIG 82 09/08/2018 1207   HDL 77 09/08/2018 1207   LDLCALC 130 (H) 09/08/2018 1207   Hepatic Function Panel     Component Value Date/Time  PROT 7.2 09/08/2018 1207   ALBUMIN 4.2 09/08/2018 1207   AST 14 09/08/2018 1207   ALT 16 09/08/2018 1207   ALKPHOS 90 09/08/2018 1207   BILITOT <0.2 09/08/2018 1207      Component Value Date/Time   TSH 0.640 09/08/2018 1207      I, Trixie Dredge, am acting as transcriptionist for Ilene Qua, MD  I have reviewed the above documentation for accuracy and completeness, and I agree with the above. - Ilene Qua, MD

## 2019-03-19 ENCOUNTER — Other Ambulatory Visit: Payer: Self-pay

## 2019-03-19 ENCOUNTER — Encounter (INDEPENDENT_AMBULATORY_CARE_PROVIDER_SITE_OTHER): Payer: Self-pay | Admitting: Family Medicine

## 2019-03-19 ENCOUNTER — Telehealth (INDEPENDENT_AMBULATORY_CARE_PROVIDER_SITE_OTHER): Payer: BC Managed Care – PPO | Admitting: Family Medicine

## 2019-03-19 DIAGNOSIS — E559 Vitamin D deficiency, unspecified: Secondary | ICD-10-CM | POA: Diagnosis not present

## 2019-03-19 DIAGNOSIS — Z6837 Body mass index (BMI) 37.0-37.9, adult: Secondary | ICD-10-CM | POA: Diagnosis not present

## 2019-03-19 DIAGNOSIS — F3289 Other specified depressive episodes: Secondary | ICD-10-CM | POA: Diagnosis not present

## 2019-03-19 MED ORDER — BUPROPION HCL ER (SR) 150 MG PO TB12: 150.0000 mg | ORAL_TABLET | Freq: Every day | ORAL | 0 refills | Status: DC

## 2019-03-19 MED ORDER — VITAMIN D (ERGOCALCIFEROL) 1.25 MG (50000 UNIT) PO CAPS: 50000.0000 [IU] | ORAL_CAPSULE | ORAL | 0 refills | Status: DC

## 2019-03-24 NOTE — Progress Notes (Signed)
Office: [phone]  /  Fax: [phone] TeleHealth Visit:  Christy PinaHeather J Fitzgerald has verbally consented to this TeleHealth visit today. The patient is located at home, the provider is located at the [facility] office. The participants in this visit include the listed provider and patient. The visit was conducted today via face time.  HPI:   Chief Complaint: OBESITY [name] is here to discuss her progress with her obesity treatment plan. She is on the Category 3 plan and is following her eating plan approximately 70 % of the time. She states she is walking for 30 minutes 1 time per week. [name]'s weight is of 210.5 lbs today (the same as last appointment). She notes work has been much more stressful and demanding. She finds that Category 3 to be easiest to follow even with decreased time.  We were unable to weigh the patient today for this TeleHealth visit. She feels as if she has maintained her weight since her last visit. She has lost 19 lbs since starting treatment with us.  Vitamin D Deficiency [name] has a diagnosis of vitamin D deficiency. She is currently taking prescription Vit D. She notes fatigue and denies nausea, vomiting or muscle weakness.  Depression with Emotional Eating Behaviors [name] notes slight improvement in symptoms with Wellbutrin. [name] struggles with emotional eating and using food for comfort to the extent that it is negatively impacting her health. She often snacks when she is not hungry. [name] sometimes feels she is out of control and then feels guilty that she made poor food choices. She has been working on behavior modification techniques to help reduce her emotional eating and has been somewhat successful. She shows no sign of suicidal or homicidal ideations.  [facility] 09/08/2018  Decreased Interest 2  Down, Depressed, Hopeless 1  PHQ - 2 Score 3  Altered sleeping 0  Tired, decreased energy 3  Change in appetite 2   Feeling bad or failure about yourself  1  Trouble concentrating 1  Moving slowly or fidgety/restless 0  Suicidal thoughts 0  PHQ-9 Score 10  Difficult doing work/chores Not difficult at all    ASSESSMENT AND PLAN:  Class 2 severe obesity with serious comorbidity and body mass index (BMI) of 37.0 to 37.9 in adult, unspecified obesity type (HCC)  Vitamin D deficiency - Plan: Vitamin D, Ergocalciferol, (DRISDOL) 1.25 MG (50000 UT) CAPS capsule  Other depression - Plan: buPROPion (WELLBUTRIN SR) 150 MG 12 hr tablet  PLAN:  Vitamin D Deficiency [name] was informed that low vitamin D levels contributes to fatigue and are associated with obesity, breast, and colon cancer. [name] agrees to continue taking prescription Vit D 50,000 IU every week #4 and we will refill for 1 month. She will follow up for routine testing of vitamin D, at least 2-3 times per year. She was informed of the risk of over-replacement of vitamin D and agrees to not increase her dose unless she discusses this with us first. [name] agrees to follow up with our clinic in 3 weeks.  Depression with Emotional Eating Behaviors We discussed behavior modification techniques today to help [name] deal with her emotional eating and depression. [name] agrees to continue taking Wellbutrin SR 150 mg PO q daily #30 with no refill for 1 month. [name] agrees to follow up with our clinic in 3 weeks.  Obesity [name] is currently in the action stage of change. As such, her goal is to continue with weight loss efforts She has  agreed to follow the Category 3 plan [name] has been instructed to work up to a goal of 150 minutes of combined cardio and strengthening exercise per week for weight loss and overall health benefits. We discussed the following Behavioral Modification Strategies today: increasing lean protein intake, increasing vegetables and work on meal planning and easy cooking plans, keeping healthy foods in the home, and  planning for success   [name] has agreed to follow up with our clinic in 3 weeks. She was informed of the importance of frequent follow up visits to maximize her success with intensive lifestyle modifications for her multiple health conditions.  ALLERGIES: Allergies  Allergen Reactions  . Sulfa Antibiotics Rash    MEDICATIONS: Current Outpatient Medications on File Prior to Visit  Medication Sig Dispense Refill  . Cetirizine HCl (ZYRTEC ALLERGY) 10 MG CAPS Take 1 capsule by mouth daily.    . Cranberry 1000 MG CAPS Take 1 capsule by mouth daily.    [name] ibuprofen (ADVIL,MOTRIN) 100 MG tablet Take 100 mg by mouth every 6 (six) hours as needed for fever.    [name] Oil 1000 MG CAPS Take 1 capsule by mouth daily.    [name] levonorgestrel-ethinyl estradiol (AVIANE) 0.1-20 MG-MCG tablet Take 1 tablet by mouth daily.    . Probiotic Product (PROBIOTIC-10 PO) Take 1 capsule by mouth daily.    . pseudoephedrine (SUDAFED) 30 MG tablet Take 30 mg by mouth every 4 (four) hours as needed for congestion.     No current facility-administered medications on file prior to visit.     PAST MEDICAL HISTORY: Past Medical History:  Diagnosis Date  . Back pain   . Joint pain   . Kidney problem   . Swallowing difficulty     PAST SURGICAL HISTORY: Past Surgical History:  Procedure Laterality Date  . CESAREAN SECTION     x2    SOCIAL HISTORY: Social History   Tobacco Use  . Smoking status: Never Smoker  . Smokeless tobacco: Never Used  Substance Use Topics  . Alcohol use: Not Currently  . Drug use: Never    FAMILY HISTORY: History reviewed. No pertinent family history.  ROS: Review of Systems  Constitutional: Positive for malaise/fatigue. Negative for weight loss.  Gastrointestinal: Negative for nausea and vomiting.  Musculoskeletal:       Negative muscle weakness  Psychiatric/Behavioral: Positive for depression. Negative for suicidal ideas.    PHYSICAL EXAM: Pt in no acute distress   RECENT LABS AND TESTS: BMET    Component Value Date/Time   NA 146 (H) 09/08/2018 1207   K 4.8 09/08/2018 1207   CL 106 09/08/2018 1207   CO2 22 09/08/2018 1207   GLUCOSE 86 09/08/2018 1207   BUN 10 09/08/2018 1207   CREATININE 0.69 09/08/2018 1207   CALCIUM 9.2 09/08/2018 1207   GFRNONAA 108 09/08/2018 1207   GFRAA 125 09/08/2018 1207   Lab Results  Component Value Date   HGBA1C 5.3 09/08/2018   Lab Results  Component Value Date   INSULIN 18.7 09/08/2018   CBC    Component Value Date/Time   WBC 10.0 09/08/2018 1207   WBC 8.9 07/07/2008 0505   RBC 4.71 09/08/2018 1207   RBC 3.90 07/07/2008 0505   HGB 13.4 09/08/2018 1207   HCT 40.6 09/08/2018 1207   PLT 254 07/07/2008 0505   MCV 86 09/08/2018 1207   MCH 28.5 09/08/2018 1207   MCHC 33.0 09/08/2018 1207   MCHC 34.2 07/07/2008 0505   RDW 12.1  09/08/2018 1207   LYMPHSABS 2.0 09/08/2018 1207   EOSABS 0.1 09/08/2018 1207   BASOSABS 0.0 09/08/2018 1207   Iron/TIBC/Ferritin/ %Sat No results found for: IRON, TIBC, FERRITIN, IRONPCTSAT Lipid Panel     Component Value Date/Time   CHOL 223 (H) 09/08/2018 1207   TRIG 82 09/08/2018 1207   HDL 77 09/08/2018 1207   LDLCALC 130 (H) 09/08/2018 1207   Hepatic Function Panel     Component Value Date/Time   PROT 7.2 09/08/2018 1207   ALBUMIN 4.2 09/08/2018 1207   AST 14 09/08/2018 1207   ALT 16 09/08/2018 1207   ALKPHOS 90 09/08/2018 1207   BILITOT <0.2 09/08/2018 1207      Component Value Date/Time   TSH 0.640 09/08/2018 1207      I, Trixie Dredge, am acting as transcriptionist for Ilene Qua, MD  I have reviewed the above documentation for accuracy and completeness, and I agree with the above. - Ilene Qua, MD

## 2019-04-09 ENCOUNTER — Ambulatory Visit (INDEPENDENT_AMBULATORY_CARE_PROVIDER_SITE_OTHER): Payer: BC Managed Care – PPO | Admitting: Family Medicine

## 2019-04-09 ENCOUNTER — Encounter (INDEPENDENT_AMBULATORY_CARE_PROVIDER_SITE_OTHER): Payer: Self-pay | Admitting: Family Medicine

## 2019-04-09 ENCOUNTER — Other Ambulatory Visit: Payer: Self-pay

## 2019-04-09 DIAGNOSIS — E559 Vitamin D deficiency, unspecified: Secondary | ICD-10-CM

## 2019-04-09 DIAGNOSIS — J309 Allergic rhinitis, unspecified: Secondary | ICD-10-CM | POA: Diagnosis not present

## 2019-04-09 DIAGNOSIS — Z6837 Body mass index (BMI) 37.0-37.9, adult: Secondary | ICD-10-CM

## 2019-04-09 MED ORDER — VITAMIN D (ERGOCALCIFEROL) 1.25 MG (50000 UNIT) PO CAPS: 50000.0000 [IU] | ORAL_CAPSULE | ORAL | 0 refills | Status: DC

## 2019-04-13 NOTE — Progress Notes (Signed)
Office: [phone]  /  Fax: [phone] TeleHealth Visit:  [name] has verbally consented to this TeleHealth visit today. The patient is located at home, the provider is located at the [facility] office. The participants in this visit include the listed provider and patient. The visit was conducted today via face time.  HPI:   Chief Complaint: OBESITY [name] is here to discuss her progress with her obesity treatment plan. She is on the Category 3 plan and is following her eating plan approximately 75 % of the time. She states she is walking for 20 minutes 2 times per week. [name] voices the last few weeks have been good. She went to [facility], and went zip-lining and white water rafting with her husband. She is feeling her clothes are looser but her weight is stuck at 211 lbs. She feels Category 3 is the easiest to follow.  We were unable to weigh the patient today for this TeleHealth visit. She feels as if she has maintained her weight since her last visit. She has lost 19 lbs since starting treatment with [name].  Vitamin D Deficiency [name] has a diagnosis of vitamin D deficiency. She is currently taking prescription Vit D. She notes fatigue and denies nausea, vomiting or muscle weakness.  Allergic Rhinitis [place] has a diagnosis of allergic rhinitis. She notes rhinorrhea. She is on Zyrtec and notes symptoms are relatively well controlled.  ASSESSMENT AND PLAN:  Allergic rhinitis, unspecified seasonality, unspecified trigger  Vitamin D deficiency - Plan: Vitamin D, Ergocalciferol, (DRISDOL) 1.25 MG (50000 UT) CAPS capsule  Class 2 severe obesity with serious comorbidity and body mass index (BMI) of 37.0 to 37.9 in adult, unspecified obesity type (HCC)  PLAN:  Vitamin D Deficiency Madia was informed that low vitamin D levels contributes to fatigue and are associated with obesity, breast, and colon cancer. [name] agrees to continue taking  prescription Vit D 50,000 IU every week #4 and we will refill for 1 month. She will follow up for routine testing of vitamin D, at least 2-3 times per year. She was informed of the risk of over-replacement of vitamin D and agrees to not increase her dose unless she discusses this with [name] first. [name] agrees to follow up with our clinic in 2 weeks.  Allergic Rhinitis [name] agrees to continue taking Zyrtec, she is to consider adding Flonase if needed. [name] agrees to follow up with our clinic in 2 weeks.  Obesity [name] is currently in the action stage of change. As such, her goal is to continue with weight loss efforts She has agreed to follow the Category 3 plan [name] has been instructed to work up to a goal of 150 minutes of combined cardio and strengthening exercise per week for weight loss and overall health benefits. We discussed the following Behavioral Modification Strategies today: increasing lean protein intake, increasing vegetables and work on meal planning and easy cooking plans, keeping healthy foods in the home, and planning for success   [name] has agreed to follow up with our clinic in 2 weeks. She was informed of the importance of frequent follow up visits to maximize her success with intensive lifestyle modifications for her multiple health conditions.  ALLERGIES: Allergies  Allergen Reactions  . Sulfa Antibiotics Rash    MEDICATIONS: Current Outpatient Medications on File Prior to Visit  Medication Sig Dispense Refill  . buPROPion (WELLBUTRIN SR) 150 MG 12 hr tablet Take 1 tablet (150 mg total) by mouth daily. 30  tablet 0  . Cetirizine HCl (ZYRTEC ALLERGY) 10 MG CAPS Take 1 capsule by mouth daily.    . Cranberry 1000 MG CAPS Take 1 capsule by mouth daily.    [name] ibuprofen (ADVIL,MOTRIN) 100 MG tablet Take 100 mg by mouth every 6 (six) hours as needed for fever.    [name] Oil 1000 MG CAPS Take 1 capsule by mouth daily.    [name] levonorgestrel-ethinyl estradiol  (AVIANE) 0.1-20 MG-MCG tablet Take 1 tablet by mouth daily.    . Probiotic Product (PROBIOTIC-10 PO) Take 1 capsule by mouth daily.    . pseudoephedrine (SUDAFED) 30 MG tablet Take 30 mg by mouth every 4 (four) hours as needed for congestion.     No current facility-administered medications on file prior to visit.     PAST MEDICAL HISTORY: Past Medical History:  Diagnosis Date  . Back pain   . Joint pain   . Kidney problem   . Swallowing difficulty     PAST SURGICAL HISTORY: Past Surgical History:  Procedure Laterality Date  . CESAREAN SECTION     x2    SOCIAL HISTORY: Social History   Tobacco Use  . Smoking status: Never Smoker  . Smokeless tobacco: Never Used  Substance Use Topics  . Alcohol use: Not Currently  . Drug use: Never    FAMILY HISTORY: History reviewed. No pertinent family history.  ROS: Review of Systems  Constitutional: Positive for malaise/fatigue. Negative for weight loss.  HENT:       + Rhinorrhea  Gastrointestinal: Negative for nausea and vomiting.  Musculoskeletal:       Negative muscle weakness    PHYSICAL EXAM: Pt in no acute distress  RECENT LABS AND TESTS: BMET    Component Value Date/Time   NA 146 (H) 09/08/2018 1207   K 4.8 09/08/2018 1207   CL 106 09/08/2018 1207   CO2 22 09/08/2018 1207   GLUCOSE 86 09/08/2018 1207   BUN 10 09/08/2018 1207   CREATININE 0.69 09/08/2018 1207   CALCIUM 9.2 09/08/2018 1207   GFRNONAA 108 09/08/2018 1207   GFRAA 125 09/08/2018 1207   Lab Results  Component Value Date   HGBA1C 5.3 09/08/2018   Lab Results  Component Value Date   INSULIN 18.7 09/08/2018   CBC    Component Value Date/Time   WBC 10.0 09/08/2018 1207   WBC 8.9 07/07/2008 0505   RBC 4.71 09/08/2018 1207   RBC 3.90 07/07/2008 0505   HGB 13.4 09/08/2018 1207   HCT 40.6 09/08/2018 1207   PLT 254 07/07/2008 0505   MCV 86 09/08/2018 1207   MCH 28.5 09/08/2018 1207   MCHC 33.0 09/08/2018 1207   MCHC 34.2 07/07/2008 0505    RDW 12.1 09/08/2018 1207   LYMPHSABS 2.0 09/08/2018 1207   EOSABS 0.1 09/08/2018 1207   BASOSABS 0.0 09/08/2018 1207   Iron/TIBC/Ferritin/ %Sat No results found for: IRON, TIBC, FERRITIN, IRONPCTSAT Lipid Panel     Component Value Date/Time   CHOL 223 (H) 09/08/2018 1207   TRIG 82 09/08/2018 1207   HDL 77 09/08/2018 1207   LDLCALC 130 (H) 09/08/2018 1207   Hepatic Function Panel     Component Value Date/Time   PROT 7.2 09/08/2018 1207   ALBUMIN 4.2 09/08/2018 1207   AST 14 09/08/2018 1207   ALT 16 09/08/2018 1207   ALKPHOS 90 09/08/2018 1207   BILITOT <0.2 09/08/2018 1207      Component Value Date/Time   TSH 0.640 09/08/2018 1207  I, Trixie Dredge, am acting as transcriptionist for Ilene Qua, MD  I have reviewed the above documentation for accuracy and completeness, and I agree with the above. - Ilene Qua, MD

## 2019-04-23 ENCOUNTER — Ambulatory Visit (INDEPENDENT_AMBULATORY_CARE_PROVIDER_SITE_OTHER): Payer: BC Managed Care – PPO | Admitting: Family Medicine

## 2019-04-23 ENCOUNTER — Other Ambulatory Visit: Payer: Self-pay

## 2019-04-23 ENCOUNTER — Encounter (INDEPENDENT_AMBULATORY_CARE_PROVIDER_SITE_OTHER): Payer: Self-pay | Admitting: Family Medicine

## 2019-04-23 DIAGNOSIS — E7849 Other hyperlipidemia: Secondary | ICD-10-CM | POA: Diagnosis not present

## 2019-04-23 DIAGNOSIS — Z6837 Body mass index (BMI) 37.0-37.9, adult: Secondary | ICD-10-CM | POA: Diagnosis not present

## 2019-04-23 DIAGNOSIS — F3289 Other specified depressive episodes: Secondary | ICD-10-CM

## 2019-04-23 MED ORDER — BUPROPION HCL ER (SR) 150 MG PO TB12: 150.0000 mg | ORAL_TABLET | Freq: Every day | ORAL | 0 refills | Status: DC

## 2019-04-26 NOTE — Progress Notes (Signed)
Office: [phone]  /  Fax: [phone] TeleHealth Visit:  [name] has verbally consented to this TeleHealth visit today. The patient is located at home, the provider is located at the [facility] office. The participants in this visit include the listed provider and patient. The visit was conducted today via face time.  HPI:   Chief Complaint: OBESITY [name] is here to discuss her progress with her obesity treatment plan. She is on the Category 3 plan and is following her eating plan approximately 75 % of the time. She states she is walking for 30 minutes 2-4 times per week. [name] has had increase in demands at work this week. She was off most of last week. She relaxed during her time off. She notes being occasionally hungry, and notes its a mixture of hunger and cravings. She is doing Yasso bars for snacks and 100 calorie snickers. Her weight is of 211 lbs, same as the last appointment. She does think throughout the day she isn't eating all of her food on the plan.  We were unable to weigh the patient today for this TeleHealth visit. She feels as if she has maintained her weight since her last visit. She has lost 19 lbs since starting treatment with [place].  Hyperlipidemia [name] has hyperlipidemia and has been trying to improve her cholesterol levels with intensive lifestyle modification including a low saturated fat diet, exercise and weight loss. Last LDL was of 130, HDL of 77, and triglycerides of 82. She denies any chest pain, claudication or myalgias.  Depression with Emotional Eating Behaviors [name] notes her symptoms are better controlled. [name] struggles with emotional eating and using food for comfort to the extent that it is negatively impacting her health. She often snacks when she is not hungry. [name] sometimes feels she is out of control and then feels guilty that she made poor food choices. She has been working on behavior modification  techniques to help reduce her emotional eating and has been somewhat successful. She shows no sign of suicidal or homicidal ideations.  Depression screen [facility] 2/9 09/08/2018  Decreased Interest 2  Down, Depressed, Hopeless 1  PHQ - 2 Score 3  Altered sleeping 0  Tired, decreased energy 3  Change in appetite 2  Feeling bad or failure about yourself  1  Trouble concentrating 1  Moving slowly or fidgety/restless 0  Suicidal thoughts 0  PHQ-9 Score 10  Difficult doing work/chores Not difficult at all    ASSESSMENT AND PLAN:  Other depression - Plan: buPROPion (WELLBUTRIN SR) 150 MG 12 hr tablet  Other hyperlipidemia  Class 2 severe obesity with serious comorbidity and body mass index (BMI) of 37.0 to 37.9 in adult, unspecified obesity type (HCC)  PLAN:  Hyperlipidemia [name] was informed of the American Heart Association Guidelines emphasizing intensive lifestyle modifications as the first line treatment for hyperlipidemia. We discussed many lifestyle modifications today in depth, and [name] will continue to work on decreasing saturated fats such as fatty red meat, butter and many fried foods. She will also increase vegetables and lean protein in her diet and continue to work on exercise and weight loss efforts. We will repeat labs at her first in office appointment.  Depression with Emotional Eating Behaviors We discussed behavior modification techniques today to help Kemora deal with her emotional eating and depression. [name] agrees to continue taking Wellbutrin SR 150 mg PO daily #30 and we will refill for 1 month. [name] agrees to follow up with  our clinic in 2 weeks.  Obesity [name] is currently in the action stage of change. As such, her goal is to continue with weight loss efforts She has agreed to follow the Category 3 plan [name] has been instructed to work up to a goal of 150 minutes of combined cardio and strengthening exercise per week for weight loss and overall  health benefits. We discussed the following Behavioral Modification Strategies today: increasing lean protein intake, increasing vegetables and work on meal planning and easy cooking plans, keeping healthy foods in the home, and planning for success We will likely need to repeat metabolism at her first in office appointment.  [name] has agreed to follow up with our clinic in 2 weeks with [employer], FNP-C. She was informed of the importance of frequent follow up visits to maximize her success with intensive lifestyle modifications for her multiple health conditions.  ALLERGIES: Allergies  Allergen Reactions  . Sulfa Antibiotics Rash    MEDICATIONS: Current Outpatient Medications on File Prior to Visit  Medication Sig Dispense Refill  . Cetirizine HCl (ZYRTEC ALLERGY) 10 MG CAPS Take 1 capsule by mouth daily.    . Cranberry 1000 MG CAPS Take 1 capsule by mouth daily.    [name] ibuprofen (ADVIL,MOTRIN) 100 MG tablet Take 100 mg by mouth every 6 (six) hours as needed for fever.    [name] Oil 1000 MG CAPS Take 1 capsule by mouth daily.    [name] levonorgestrel-ethinyl estradiol (AVIANE) 0.1-20 MG-MCG tablet Take 1 tablet by mouth daily.    . Probiotic Product (PROBIOTIC-10 PO) Take 1 capsule by mouth daily.    . pseudoephedrine (SUDAFED) 30 MG tablet Take 30 mg by mouth every 4 (four) hours as needed for congestion.    . Vitamin D, Ergocalciferol, (DRISDOL) 1.25 MG (50000 UT) CAPS capsule Take 1 capsule (50,000 Units total) by mouth every 7 (seven) days. 4 capsule 0   No current facility-administered medications on file prior to visit.     PAST MEDICAL HISTORY: Past Medical History:  Diagnosis Date  . Back pain   . Joint pain   . Kidney problem   . Swallowing difficulty     PAST SURGICAL HISTORY: Past Surgical History:  Procedure Laterality Date  . CESAREAN SECTION     x2    SOCIAL HISTORY: Social History   Tobacco Use  . Smoking status: Never Smoker  . Smokeless tobacco: Never  Used  Substance Use Topics  . Alcohol use: Not Currently  . Drug use: Never    FAMILY HISTORY: History reviewed. No pertinent family history.  ROS: Review of Systems  Constitutional: Negative for weight loss.  Cardiovascular: Negative for chest pain and claudication.  Musculoskeletal: Negative for myalgias.  Psychiatric/Behavioral: Positive for depression. Negative for suicidal ideas.    PHYSICAL EXAM: Pt in no acute distress  RECENT LABS AND TESTS: BMET    Component Value Date/Time   NA 146 (H) 09/08/2018 1207   K 4.8 09/08/2018 1207   CL 106 09/08/2018 1207   CO2 22 09/08/2018 1207   GLUCOSE 86 09/08/2018 1207   BUN 10 09/08/2018 1207   CREATININE 0.69 09/08/2018 1207   CALCIUM 9.2 09/08/2018 1207   GFRNONAA 108 09/08/2018 1207   GFRAA 125 09/08/2018 1207   Lab Results  Component Value Date   HGBA1C 5.3 09/08/2018   Lab Results  Component Value Date   INSULIN 18.7 09/08/2018   CBC    Component Value Date/Time   WBC 10.0 09/08/2018 1207  WBC 8.9 07/07/2008 0505   RBC 4.71 09/08/2018 1207   RBC 3.90 07/07/2008 0505   HGB 13.4 09/08/2018 1207   HCT 40.6 09/08/2018 1207   PLT 254 07/07/2008 0505   MCV 86 09/08/2018 1207   MCH 28.5 09/08/2018 1207   MCHC 33.0 09/08/2018 1207   MCHC 34.2 07/07/2008 0505   RDW 12.1 09/08/2018 1207   LYMPHSABS 2.0 09/08/2018 1207   EOSABS 0.1 09/08/2018 1207   BASOSABS 0.0 09/08/2018 1207   Iron/TIBC/Ferritin/ %Sat No results found for: IRON, TIBC, FERRITIN, IRONPCTSAT Lipid Panel     Component Value Date/Time   CHOL 223 (H) 09/08/2018 1207   TRIG 82 09/08/2018 1207   HDL 77 09/08/2018 1207   LDLCALC 130 (H) 09/08/2018 1207   Hepatic Function Panel     Component Value Date/Time   PROT 7.2 09/08/2018 1207   ALBUMIN 4.2 09/08/2018 1207   AST 14 09/08/2018 1207   ALT 16 09/08/2018 1207   ALKPHOS 90 09/08/2018 1207   BILITOT <0.2 09/08/2018 1207      Component Value Date/Time   TSH 0.640 09/08/2018 1207       I, Burt KnackSharon Martin, am acting as transcriptionist for Debbra RidingAlexandria Kadolph, MD  I have reviewed the above documentation for accuracy and completeness, and I agree with the above. - Debbra RidingAlexandria Kadolph, MD

## 2019-05-07 ENCOUNTER — Other Ambulatory Visit: Payer: Self-pay

## 2019-05-07 ENCOUNTER — Ambulatory Visit (INDEPENDENT_AMBULATORY_CARE_PROVIDER_SITE_OTHER): Payer: BC Managed Care – PPO | Admitting: Family Medicine

## 2019-05-07 ENCOUNTER — Encounter (INDEPENDENT_AMBULATORY_CARE_PROVIDER_SITE_OTHER): Payer: Self-pay | Admitting: Family Medicine

## 2019-05-07 DIAGNOSIS — E559 Vitamin D deficiency, unspecified: Secondary | ICD-10-CM

## 2019-05-07 DIAGNOSIS — Z6837 Body mass index (BMI) 37.0-37.9, adult: Secondary | ICD-10-CM

## 2019-05-07 DIAGNOSIS — F3289 Other specified depressive episodes: Secondary | ICD-10-CM | POA: Diagnosis not present

## 2019-05-07 MED ORDER — BUPROPION HCL ER (SR) 150 MG PO TB12
150.0000 mg | ORAL_TABLET | Freq: Two times a day (BID) | ORAL | 0 refills | Status: DC
Start: 2019-05-07 — End: 2019-06-01

## 2019-05-07 MED ORDER — VITAMIN D (ERGOCALCIFEROL) 1.25 MG (50000 UNIT) PO CAPS: 50000.0000 [IU] | ORAL_CAPSULE | ORAL | 0 refills | Status: DC

## 2019-05-12 NOTE — Progress Notes (Signed)
Office: [phone]  /  Fax: [phone] TeleHealth Visit:  Christy PinaHeather J Fitzgerald has verbally consented to this TeleHealth visit today. The patient is located at home, the provider is located at the [facility] office. The participants in this visit include the listed provider and patient and any and all parties involved. The visit was conducted today via FaceTime.  HPI:   Chief Complaint: OBESITY [name] is here to discuss her progress with her obesity treatment plan. She is on the Category 3 plan and is following her eating plan approximately 70 % of the time. She states she is walking 30 minutes 2 times per week. [name] has not had an in-office visit since 09/22/18, but she has followed with telehealth visits. She reports a 1 pound weight loss since her last telehealth visit. Work has been very busy. [name] does report some cravings and she denies polyphagia. She is tired of yogurt on the plan. We were unable to weigh the patient today for this TeleHealth visit. She feels as if she has lost weight since her last visit (weight 210 lbs 05/07/19). She has lost 8 lbs since starting treatment with us.  Vitamin D deficiency [name] has a diagnosis of vitamin D deficiency. Her last vitamin D was at 35.9 on 09/08/18 and was not at goal. She is currently taking vit D and denies nausea, vomiting or muscle weakness.  Depression with emotional eating behaviors [name]'s mood is stable. She voices that she tends to eat more at work, when she is stressed. We discussed referring her to Dr. Dewaine CongerBarker in the future and she will keep this in mind. [name] struggles with emotional eating and using food for comfort to the extent that it is negatively impacting her health. She often snacks when she is not hungry. [name] sometimes feels she is out of control and then feels guilty that she made poor food choices. She has been working on behavior modification techniques to help reduce her emotional eating  and has been somewhat successful. She shows no sign of suicidal or homicidal ideations.  ASSESSMENT AND PLAN:  Other depression - with emotional eating - Plan: buPROPion (WELLBUTRIN SR) 150 MG 12 hr tablet  Vitamin D deficiency - Plan: Vitamin D, Ergocalciferol, (DRISDOL) 1.25 MG (50000 UT) CAPS capsule  Class 2 severe obesity with serious comorbidity and body mass index (BMI) of 37.0 to 37.9 in adult, unspecified obesity type (HCC)  PLAN:  Vitamin D Deficiency [name] was informed that low vitamin D levels contributes to fatigue and are associated with obesity, breast, and colon cancer. [name] agrees to continue to take prescription Vit D @50 ,000 IU every week #4 and she will follow up for routine testing of vitamin D, at least 2-3 times per year. She was informed of the risk of over-replacement of vitamin D and agrees to not increase her dose unless she discusses this with us first. We will check vitamin D level at the next visit and [name] agrees to follow up as directed.  Depression with Emotional Eating Behaviors We discussed behavior modification techniques today to help [name] deal with her emotional eating and depression. She has agreed to take Wellbutrin SR 150 mg qAM #30 with no refills and follow up as directed.  Obesity [name] is currently in the action stage of change. As such, her goal is to continue with weight loss efforts She has agreed to follow the Category 3 plan with additional breakfast options [name] will continue walking 30 minutes, 2 times per  week for weight loss and overall health benefits. We discussed the following Behavioral Modification Strategies today: planning for success, better snacking choices, increasing lean protein intake and work on meal planning and easy cooking plans  Breakfast options and handout were sent via [place]. We discussed substitutions for yogurt.  Izzabella has agreed to follow up with our clinic in 2 to 3 weeks. She was informed of  the importance of frequent follow up visits to maximize her success with intensive lifestyle modifications for her multiple health conditions.  ALLERGIES: Allergies  Allergen Reactions   Sulfa Antibiotics Rash    MEDICATIONS: Current Outpatient Medications on File Prior to Visit  Medication Sig Dispense Refill   Cetirizine HCl (ZYRTEC ALLERGY) 10 MG CAPS Take 1 capsule by mouth daily.     Cranberry 1000 MG CAPS Take 1 capsule by mouth daily.     ibuprofen (ADVIL,MOTRIN) 100 MG tablet Take 100 mg by mouth every 6 (six) hours as needed for fever.     Krill Oil 1000 MG CAPS Take 1 capsule by mouth daily.     levonorgestrel-ethinyl estradiol (AVIANE) 0.1-20 MG-MCG tablet Take 1 tablet by mouth daily.     Probiotic Product (PROBIOTIC-10 PO) Take 1 capsule by mouth daily.     pseudoephedrine (SUDAFED) 30 MG tablet Take 30 mg by mouth every 4 (four) hours as needed for congestion.     No current facility-administered medications on file prior to visit.     PAST MEDICAL HISTORY: Past Medical History:  Diagnosis Date   Back pain    Joint pain    Kidney problem    Swallowing difficulty     PAST SURGICAL HISTORY: Past Surgical History:  Procedure Laterality Date   CESAREAN SECTION     x2    SOCIAL HISTORY: Social History   Tobacco Use   Smoking status: Never Smoker   Smokeless tobacco: Never Used  Substance Use Topics   Alcohol use: Not Currently   Drug use: Never    FAMILY HISTORY: History reviewed. No pertinent family history.  ROS: Review of Systems  Constitutional: Positive for weight loss.  Gastrointestinal: Negative for nausea and vomiting.  Musculoskeletal:       Negative for muscle weakness  Endo/Heme/Allergies:       Positive for cravings Negative for polyphagia  Psychiatric/Behavioral: Positive for depression. Negative for suicidal ideas.    PHYSICAL EXAM: Pt in no acute distress  RECENT LABS AND TESTS: BMET    Component Value  Date/Time   NA 146 (H) 09/08/2018 1207   K 4.8 09/08/2018 1207   CL 106 09/08/2018 1207   CO2 22 09/08/2018 1207   GLUCOSE 86 09/08/2018 1207   BUN 10 09/08/2018 1207   CREATININE 0.69 09/08/2018 1207   CALCIUM 9.2 09/08/2018 1207   GFRNONAA 108 09/08/2018 1207   GFRAA 125 09/08/2018 1207   Lab Results  Component Value Date   HGBA1C 5.3 09/08/2018   Lab Results  Component Value Date   INSULIN 18.7 09/08/2018   CBC    Component Value Date/Time   WBC 10.0 09/08/2018 1207   WBC 8.9 07/07/2008 0505   RBC 4.71 09/08/2018 1207   RBC 3.90 07/07/2008 0505   HGB 13.4 09/08/2018 1207   HCT 40.6 09/08/2018 1207   PLT 254 07/07/2008 0505   MCV 86 09/08/2018 1207   MCH 28.5 09/08/2018 1207   MCHC 33.0 09/08/2018 1207   MCHC 34.2 07/07/2008 0505   RDW 12.1 09/08/2018 1207   LYMPHSABS 2.0  09/08/2018 1207   EOSABS 0.1 09/08/2018 1207   BASOSABS 0.0 09/08/2018 1207   Iron/TIBC/Ferritin/ %Sat No results found for: IRON, TIBC, FERRITIN, IRONPCTSAT Lipid Panel     Component Value Date/Time   CHOL 223 (H) 09/08/2018 1207   TRIG 82 09/08/2018 1207   HDL 77 09/08/2018 1207   LDLCALC 130 (H) 09/08/2018 1207   Hepatic Function Panel     Component Value Date/Time   PROT 7.2 09/08/2018 1207   ALBUMIN 4.2 09/08/2018 1207   AST 14 09/08/2018 1207   ALT 16 09/08/2018 1207   ALKPHOS 90 09/08/2018 1207   BILITOT <0.2 09/08/2018 1207      Component Value Date/Time   TSH 0.640 09/08/2018 1207     Ref. Range 09/08/2018 12:07  Vitamin D, 25-Hydroxy Latest Ref Range: 30.0 - 100.0 ng/mL 35.9    I, [place] Crane, am acting as Energy manager for [place], FNP-C  I have reviewed the above documentation for accuracy and completeness, and I agree with the above.  - Demitri Kucinski, FNP-C.

## 2019-05-13 ENCOUNTER — Encounter (INDEPENDENT_AMBULATORY_CARE_PROVIDER_SITE_OTHER): Payer: Self-pay | Admitting: Family Medicine

## 2019-05-13 DIAGNOSIS — E559 Vitamin D deficiency, unspecified: Secondary | ICD-10-CM | POA: Insufficient documentation

## 2019-05-13 DIAGNOSIS — E66812 Obesity, class 2: Secondary | ICD-10-CM | POA: Insufficient documentation

## 2019-05-13 DIAGNOSIS — Z6835 Body mass index (BMI) 35.0-35.9, adult: Secondary | ICD-10-CM | POA: Insufficient documentation

## 2019-05-13 DIAGNOSIS — F32A Depression, unspecified: Secondary | ICD-10-CM | POA: Insufficient documentation

## 2019-05-13 DIAGNOSIS — E669 Obesity, unspecified: Secondary | ICD-10-CM | POA: Insufficient documentation

## 2019-05-13 DIAGNOSIS — F329 Major depressive disorder, single episode, unspecified: Secondary | ICD-10-CM | POA: Insufficient documentation

## 2019-05-27 ENCOUNTER — Ambulatory Visit (INDEPENDENT_AMBULATORY_CARE_PROVIDER_SITE_OTHER): Payer: Self-pay | Admitting: Family Medicine

## 2019-06-01 ENCOUNTER — Other Ambulatory Visit: Payer: Self-pay

## 2019-06-01 ENCOUNTER — Ambulatory Visit (INDEPENDENT_AMBULATORY_CARE_PROVIDER_SITE_OTHER): Payer: BC Managed Care – PPO | Admitting: Family Medicine

## 2019-06-01 VITALS — BP 126/81 | HR 86 | Temp 98.1°F | Ht 65.0 in | Wt 205.0 lb

## 2019-06-01 DIAGNOSIS — E669 Obesity, unspecified: Secondary | ICD-10-CM

## 2019-06-01 DIAGNOSIS — E559 Vitamin D deficiency, unspecified: Secondary | ICD-10-CM

## 2019-06-01 DIAGNOSIS — E7849 Other hyperlipidemia: Secondary | ICD-10-CM

## 2019-06-01 DIAGNOSIS — Z6834 Body mass index (BMI) 34.0-34.9, adult: Secondary | ICD-10-CM

## 2019-06-01 DIAGNOSIS — F3289 Other specified depressive episodes: Secondary | ICD-10-CM

## 2019-06-01 DIAGNOSIS — Z9189 Other specified personal risk factors, not elsewhere classified: Secondary | ICD-10-CM

## 2019-06-01 DIAGNOSIS — E538 Deficiency of other specified B group vitamins: Secondary | ICD-10-CM | POA: Diagnosis not present

## 2019-06-01 DIAGNOSIS — E8881 Metabolic syndrome: Secondary | ICD-10-CM

## 2019-06-01 MED ORDER — BUPROPION HCL ER (SR) 150 MG PO TB12: 150.0000 mg | ORAL_TABLET | Freq: Two times a day (BID) | ORAL | 0 refills | Status: DC

## 2019-06-01 MED ORDER — VITAMIN D (ERGOCALCIFEROL) 1.25 MG (50000 UNIT) PO CAPS: 50000.0000 [IU] | ORAL_CAPSULE | ORAL | 0 refills | Status: DC

## 2019-06-02 LAB — CBC WITH DIFFERENTIAL/PLATELET
Basophils Absolute: 0.1 10*3/uL (ref 0.0–0.2)
Basos: 1 %
EOS (ABSOLUTE): 0 10*3/uL (ref 0.0–0.4)
Eos: 0 %
Hematocrit: 40.4 % (ref 34.0–46.6)
Hemoglobin: 13.3 g/dL (ref 11.1–15.9)
Immature Grans (Abs): 0 10*3/uL (ref 0.0–0.1)
Immature Granulocytes: 0 %
Lymphocytes Absolute: 1.7 10*3/uL (ref 0.7–3.1)
Lymphs: 19 %
MCH: 28.4 pg (ref 26.6–33.0)
MCHC: 32.9 g/dL (ref 31.5–35.7)
MCV: 86 fL (ref 79–97)
Monocytes Absolute: 0.5 10*3/uL (ref 0.1–0.9)
Monocytes: 5 %
Neutrophils Absolute: 6.8 10*3/uL (ref 1.4–7.0)
Neutrophils: 75 %
Platelets: 368 10*3/uL (ref 150–450)
RBC: 4.69 x10E6/uL (ref 3.77–5.28)
RDW: 12.3 % (ref 11.7–15.4)
WBC: 9.1 10*3/uL (ref 3.4–10.8)

## 2019-06-02 LAB — COMPREHENSIVE METABOLIC PANEL
ALT: 18 IU/L (ref 0–32)
AST: 16 IU/L (ref 0–40)
Albumin/Globulin Ratio: 1.7 (ref 1.2–2.2)
Albumin: 4.4 g/dL (ref 3.8–4.8)
Alkaline Phosphatase: 87 IU/L (ref 39–117)
BUN/Creatinine Ratio: 14 (ref 9–23)
BUN: 12 mg/dL (ref 6–24)
Bilirubin Total: <0.2 mg/dL (ref 0.0–1.2)
CO2: 23 mmol/L (ref 20–29)
Calcium: 9.2 mg/dL (ref 8.7–10.2)
Chloride: 104 mmol/L (ref 96–106)
Creatinine, Ser: 0.85 mg/dL (ref 0.57–1.00)
GFR calc Af Amer: 98 mL/min/{1.73_m2} (ref >59)
GFR calc non Af Amer: 85 mL/min/{1.73_m2} (ref >59)
Globulin, Total: 2.6 g/dL (ref 1.5–4.5)
Glucose: 85 mg/dL (ref 65–99)
Potassium: 3.8 mmol/L (ref 3.5–5.2)
Sodium: 141 mmol/L (ref 134–144)
Total Protein: 7 g/dL (ref 6.0–8.5)

## 2019-06-02 LAB — FOLATE: Folate: >20 ng/mL (ref >3.0)

## 2019-06-02 LAB — LIPID PANEL WITH LDL/HDL RATIO
Cholesterol, Total: 199 mg/dL (ref 100–199)
HDL: 63 mg/dL (ref >39)
LDL Chol Calc (NIH): 119 mg/dL — ABNORMAL HIGH (ref 0–99)
LDL/HDL Ratio: 1.9 ratio (ref 0.0–3.2)
Triglycerides: 98 mg/dL (ref 0–149)
VLDL Cholesterol Cal: 17 mg/dL (ref 5–40)

## 2019-06-02 LAB — VITAMIN B12: Vitamin B-12: 844 pg/mL (ref 232–1245)

## 2019-06-02 LAB — VITAMIN D 25 HYDROXY (VIT D DEFICIENCY, FRACTURES): Vit D, 25-Hydroxy: 46 ng/mL (ref 30.0–100.0)

## 2019-06-02 LAB — INSULIN, RANDOM: INSULIN: 10 u[IU]/mL (ref 2.6–24.9)

## 2019-06-02 NOTE — Progress Notes (Signed)
Office: [phone]  /  Fax: [phone]   HPI:   Chief Complaint: OBESITY [name] is here to discuss her progress with her obesity treatment plan. She is on the Category 3 plan with additional breakfast options and is following her eating plan approximately 75 % of the time. She states she is exercising 0 minutes 0 times per week. [name] is doing well and she likes the diet plan. She struggles sometimes to get yogurt in. 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  Her weight is 205 lb (93 kg) today and has had a weight loss of 19 pounds over a period of 8 months since her last visit. She has lost 27 lbs since starting treatment with [place].  Vitamin D Deficiency [name] has a diagnosis of vitamin D deficiency. She is currently taking prescription Vit D and denies nausea, vomiting or muscle weakness.  At risk for osteopenia and osteoporosis [name] is at higher risk of osteopenia and osteoporosis due to vitamin D deficiency.   Depression with Emotional Eating Behaviors [name] states taking Wellbutrin BID is helping more. [name] struggles with emotional eating and using food for comfort to the extent that it is negatively impacting her health. She often snacks when she is not hungry. [name] sometimes feels she is out of control and then feels guilty that she made poor food choices. She has been working on behavior modification techniques to help reduce her emotional eating and has been somewhat successful. She shows no sign of suicidal or homicidal ideations.  Vitamin B12 Deficiency [name] has a diagnosis of B12 insufficiency. She is not vegetarian and does not have a previous diagnosis of pernicious anemia. She does not have a history of weight loss surgery.   Hyperlipidemia [name] has hyperlipidemia and has been trying to improve her cholesterol levels with intensive lifestyle modification including a low saturated fat diet, exercise and weight loss. Last LDL was 130  but  HDL at 77. She denies any chest pain, claudication or myalgias.  Insulin Resistance [name] has a diagnosis of insulin resistance based on her elevated fasting insulin level >5. Last insulin was 18.7 on 09/08/2018. Although [name]'s blood glucose readings are still under good control, insulin resistance puts her at greater risk of metabolic syndrome and diabetes. She is not taking metformin currently and continues to work on diet and exercise to decrease risk of diabetes.  ASSESSMENT AND PLAN:  Vitamin D deficiency - Plan: Vitamin D (25 hydroxy), Vitamin D, Ergocalciferol, (DRISDOL) 1.25 MG (50000 UT) CAPS capsule  B12 nutritional deficiency - Plan: B12, Folate  Other hyperlipidemia - Plan: Comprehensive metabolic panel, CBC with Differential/Platelet, Lipid Panel With LDL/HDL Ratio  Insulin resistance - Plan: Comprehensive metabolic panel, Insulin, random  Other depression - with emotional eating - Plan: buPROPion (WELLBUTRIN SR) 150 MG 12 hr tablet  At risk for osteoporosis  Class 1 obesity with serious comorbidity and body mass index (BMI) of 34.0 to 34.9 in adult, unspecified obesity type  PLAN:  Vitamin D Deficiency Brinklee was informed that low vitamin D levels contributes to fatigue and are associated with obesity, breast, and colon cancer. [name] agrees to continue taking prescription Vit D 50,000 IU every week #4 and we will refill for 1 month. She will follow up for routine testing of vitamin D, at least 2-3 times per year. She was informed of the risk of over-replacement of vitamin D and agrees to not increase her dose unless she discusses this with [place] first. We  will recheck labs today. Kaliope agrees to follow up with our clinic in 2 to 3 weeks.  At risk for osteopenia and osteoporosis [name] was given extended (15 minutes) osteoporosis prevention counseling today. [name] is at risk for osteopenia and osteoporsis due to her vitamin D deficiency. She was encouraged to  take her vitamin D and follow her higher calcium diet and increase strengthening exercise to help strengthen her bones and decrease her risk of osteopenia and osteoporosis.  Depression with Emotional Eating Behaviors We discussed behavior modification techniques today to help [name] deal with her emotional eating and depression. [name] agrees to continue taking Wellbutrin SR 150 mg PO BID #60, and we will refill for 1 month. [name] agrees to follow up with our clinic in 2 to 3 weeks.  Vitamin B12 Deficiency [name] will work on increasing B12 rich foods in her diet. B12 supplementation was not prescribed today. We will recheck labs today.  Hyperlipidemia [name] was informed of the American Heart Association Guidelines emphasizing intensive lifestyle modifications as the first line treatment for hyperlipidemia. We discussed many lifestyle modifications today in depth, and [name] will continue to work on decreasing saturated fats such as fatty red meat, butter and many fried foods. She will also increase vegetables and lean protein in her diet and continue to work on exercise and weight loss efforts. We will recheck labs today.  Insulin Resistance [name] will continue to work on weight loss, exercise, and decreasing simple carbohydrates in her diet to help decrease the risk of diabetes. We dicussed metformin including benefits and risks. She was informed that eating too many simple carbohydrates or too many calories at one sitting increases the likelihood of GI side effects. We will recheck labs today, and will start metformin if her level is still elevated. [name] agrees to follow up with [place] as directed to monitor her progress.  Obesity [name] is currently in the action stage of change. As such, her goal is to continue with weight loss efforts She has agreed to follow the Category 3 plan [name] has been instructed to work up to a goal of 150 minutes of combined cardio and strengthening  exercise per week or as tolerated for weight loss and overall health benefits. We discussed the following Behavioral Modification Strategies today: increasing lean protein intake, decreasing simple carbohydrates, increasing vegetables, increase H20 intake, work on meal planning and easy cooking plans and holiday eating strategies    Latoy has agreed to follow up with our clinic in 2 to 3 weeks. She was informed of the importance of frequent follow up visits to maximize her success with intensive lifestyle modifications for her multiple health conditions.  ALLERGIES: Allergies  Allergen Reactions  . Sulfa Antibiotics Rash    MEDICATIONS: Current Outpatient Medications on File Prior to Visit  Medication Sig Dispense Refill  . Cetirizine HCl (ZYRTEC ALLERGY) 10 MG CAPS Take 1 capsule by mouth daily.    . Cranberry 1000 MG CAPS Take 1 capsule by mouth daily.    [name] ibuprofen (ADVIL,MOTRIN) 100 MG tablet Take 100 mg by mouth every 6 (six) hours as needed for fever.    [name] Oil 1000 MG CAPS Take 1 capsule by mouth daily.    [name] levonorgestrel-ethinyl estradiol (AVIANE) 0.1-20 MG-MCG tablet Take 1 tablet by mouth daily.    . Probiotic Product (PROBIOTIC-10 PO) Take 1 capsule by mouth daily.     No current facility-administered medications on file prior to visit.     PAST MEDICAL HISTORY:  Past Medical History:  Diagnosis Date  . Back pain   . Joint pain   . Kidney problem   . Swallowing difficulty     PAST SURGICAL HISTORY: Past Surgical History:  Procedure Laterality Date  . CESAREAN SECTION     x2    SOCIAL HISTORY: Social History   Tobacco Use  . Smoking status: Never Smoker  . Smokeless tobacco: Never Used  Substance Use Topics  . Alcohol use: Not Currently  . Drug use: Never    FAMILY HISTORY: No family history on file.  ROS: Review of Systems  Constitutional: Positive for weight loss.  Cardiovascular: Negative for chest pain and claudication.    Gastrointestinal: Negative for nausea and vomiting.  Musculoskeletal: Negative for myalgias.       Negative muscle weakness  Psychiatric/Behavioral: Positive for depression. Negative for suicidal ideas.    PHYSICAL EXAM: Blood pressure 126/81, pulse 86, temperature 98.1 F (36.7 C), temperature source Oral, height  (1.651 m), weight 205 lb (93 kg), SpO2 96 %. Body mass index is 34.11 kg/m. Physical Exam Vitals signs reviewed.  Constitutional:      Appearance: Normal appearance. She is obese.  Cardiovascular:     Rate and Rhythm: Normal rate.     Pulses: Normal pulses.  Pulmonary:     Effort: Pulmonary effort is normal.     Breath sounds: Normal breath sounds.  Musculoskeletal: Normal range of motion.  Skin:    General: Skin is warm and dry.  Neurological:     Mental Status: She is alert and oriented to person, place, and time.  Psychiatric:        Mood and Affect: Mood normal.        Behavior: Behavior normal.     RECENT LABS AND TESTS: BMET    Component Value Date/Time   NA 141 06/01/2019 1246   K 3.8 06/01/2019 1246   CL 104 06/01/2019 1246   CO2 23 06/01/2019 1246   GLUCOSE 85 06/01/2019 1246   BUN 12 06/01/2019 1246   CREATININE 0.85 06/01/2019 1246   CALCIUM 9.2 06/01/2019 1246   GFRNONAA 85 06/01/2019 1246   GFRAA 98 06/01/2019 1246   Lab Results  Component Value Date   HGBA1C 5.3 09/08/2018   Lab Results  Component Value Date   INSULIN WILL FOLLOW 06/01/2019   INSULIN 18.7 09/08/2018   CBC    Component Value Date/Time   WBC 9.1 06/01/2019 1246   WBC 8.9 07/07/2008 0505   RBC 4.69 06/01/2019 1246   RBC 3.90 07/07/2008 0505   HGB 13.3 06/01/2019 1246   HCT 40.4 06/01/2019 1246   PLT 368 06/01/2019 1246   MCV 86 06/01/2019 1246   MCH 28.4 06/01/2019 1246   MCHC 32.9 06/01/2019 1246   MCHC 34.2 07/07/2008 0505   RDW 12.3 06/01/2019 1246   LYMPHSABS 1.7 06/01/2019 1246   EOSABS 0.0 06/01/2019 1246   BASOSABS 0.1 06/01/2019 1246    Iron/TIBC/Ferritin/ %Sat No results found for: IRON, TIBC, FERRITIN, IRONPCTSAT Lipid Panel     Component Value Date/Time   CHOL 199 06/01/2019 1246   TRIG 98 06/01/2019 1246   HDL 63 06/01/2019 1246   LDLCALC 119 (H) 06/01/2019 1246   Hepatic Function Panel     Component Value Date/Time   PROT 7.0 06/01/2019 1246   ALBUMIN 4.4 06/01/2019 1246   AST 16 06/01/2019 1246   ALT 18 06/01/2019 1246   ALKPHOS 87 06/01/2019 1246   BILITOT <0.2 06/01/2019 1246  Component Value Date/Time   TSH 0.640 09/08/2018 1207      OBESITY BEHAVIORAL INTERVENTION VISIT  Today's visit was # 17   Starting weight: 232 lbs Starting date: 09/08/2018 Today's weight : 205 lbs Today's date: 06/01/2019 Total lbs lost to date: 927    ASK: We discussed the diagnosis of obesity with Nanci PinaHeather J Daffern today and [name] agreed to give us permission to discuss obesity behavioral modification therapy today.  ASSESS: [name] has the diagnosis of obesity and her BMI today is 34.11 [name] is in the action stage of change   ADVISE: [name] was educated on the multiple health risks of obesity as well as the benefit of weight loss to improve her health. She was advised of the need for long term treatment and the importance of lifestyle modifications to improve her current health and to decrease her risk of future health problems.  AGREE: Multiple dietary modification options and treatment options were discussed and  Garyn agreed to follow the recommendations documented in the above note.  ARRANGE: [name] was educated on the importance of frequent visits to treat obesity as outlined per CMS and USPSTF guidelines and agreed to schedule her next follow up appointment today.  Trude McburneyI, Sharon Martin, am acting as transcriptionist for Helane RimaErica Yamel Bale, DO  I have reviewed the above documentation for accuracy and completeness, and I agree with the above. Helane Rima- Tynisa Vohs, DO

## 2019-06-03 ENCOUNTER — Encounter (INDEPENDENT_AMBULATORY_CARE_PROVIDER_SITE_OTHER): Payer: Self-pay | Admitting: Family Medicine

## 2019-06-16 ENCOUNTER — Other Ambulatory Visit: Payer: Self-pay

## 2019-06-16 ENCOUNTER — Encounter (INDEPENDENT_AMBULATORY_CARE_PROVIDER_SITE_OTHER): Payer: Self-pay | Admitting: Family Medicine

## 2019-06-16 ENCOUNTER — Telehealth (INDEPENDENT_AMBULATORY_CARE_PROVIDER_SITE_OTHER): Payer: BC Managed Care – PPO | Admitting: Family Medicine

## 2019-06-16 DIAGNOSIS — Z6834 Body mass index (BMI) 34.0-34.9, adult: Secondary | ICD-10-CM | POA: Diagnosis not present

## 2019-06-16 DIAGNOSIS — E538 Deficiency of other specified B group vitamins: Secondary | ICD-10-CM

## 2019-06-16 DIAGNOSIS — E8881 Metabolic syndrome: Secondary | ICD-10-CM

## 2019-06-16 DIAGNOSIS — E669 Obesity, unspecified: Secondary | ICD-10-CM | POA: Diagnosis not present

## 2019-06-16 MED ORDER — METFORMIN HCL 500 MG PO TABS: 500.0000 mg | ORAL_TABLET | Freq: Every day | ORAL | 0 refills | Status: DC

## 2019-06-17 NOTE — Progress Notes (Signed)
Office: [phone]  /  Fax: [phone] TeleHealth Visit:  MERVE HOTARD has verbally consented to this TeleHealth visit today. The patient is located at home, the provider is located at the [facility] and [facility] office. The participants in this visit include the listed provider and patient and any and all parties involved. The visit was conducted today via FaceTime.  HPI:   Chief Complaint: OBESITY Zollie is here to discuss her progress with her obesity treatment plan. She is on the Category 3 plan and is following her eating plan approximately 60 % of the time. She states she is exercising 0 minutes 0 times per week. Brecklynn enjoyed Thanksgiving but she did watch her portions. She has stuck to the plan. Tuana has signed up for a virtual boxing class to be done at home. We went over labs that were ordered by Dr. Earlene Plater on 06/01/19, in depth.  We were unable to weigh the patient today for this TeleHealth visit. She feels as if she has lost weight since her last visit (weight 205 lbs 06/15/19). She has lost 27 lbs since starting treatment with [place].  B12 Deficiency Lexa has a diagnosis of B12 insufficiency and she is on B complex daily. She had been on B12 daily until her last office visit. Her B12 levels are normal. Lab Results  Component Value Date   VITAMINB12 844 06/01/2019    Insulin Resistance Hatsue has a diagnosis of insulin resistance based on her elevated fasting insulin level >5.  Her fasting insulin decreased from 18.7 to 10.0 (06/01/19). Kayla does report intermittent polyphagia. She tends to have polyphagia in the morning. Chemeka is not taking metformin currently and she continues to work on diet and exercise to decrease risk of diabetes. Lab Results  Component Value Date   HGBA1C 5.3 09/08/2018    ASSESSMENT AND PLAN:  B12 nutritional deficiency  Insulin resistance - Plan: metFORMIN (GLUCOPHAGE) 500 MG tablet  Class 1 obesity with serious  comorbidity and body mass index (BMI) of 34.0 to 34.9 in adult, unspecified obesity type  PLAN:  B12 Deficiency Rozella will work on increasing B12 rich foods in her diet. She will continue B complex vitamin and follow up as directed.  Insulin Resistance Zeynab will continue to work on weight loss, exercise, and decreasing simple carbohydrates in her diet to help decrease the risk of diabetes. We dicussed metformin including benefits and risks. She was informed that eating too many simple carbohydrates or too many calories at one sitting increases the likelihood of GI side effects. Mischa agreed to start metformin for now and prescription was written today for metformin 500 mg daily with breakfast #30 with no refills. Stefan agreed to follow up with [place] as directed to monitor her progress.  Obesity Meegan is currently in the action stage of change. As such, her goal is to continue with weight loss efforts She has agreed to follow the Category 3 plan Paul will start boxing class for weight loss and overall health benefits. We discussed the following Behavioral Modification Strategies today: planning for success, increasing lean protein intake and decreasing simple carbohydrates   Jaimee has agreed to follow up with our clinic in 2 weeks. She was informed of the importance of frequent follow up visits to maximize her success with intensive lifestyle modifications for her multiple health conditions.  ALLERGIES: Allergies  Allergen Reactions  . Sulfa Antibiotics Rash    MEDICATIONS: Current Outpatient Medications on File Prior to Visit  Medication Sig Dispense  Refill  . buPROPion (WELLBUTRIN SR) 150 MG 12 hr tablet Take 1 tablet (150 mg total) by mouth 2 (two) times daily. 60 tablet 0  . Cetirizine HCl (ZYRTEC ALLERGY) 10 MG CAPS Take 1 capsule by mouth daily.    . Cranberry 1000 MG CAPS Take 1 capsule by mouth daily.    [name] ibuprofen (ADVIL,MOTRIN) 100 MG tablet Take 100 mg by mouth  every 6 (six) hours as needed for fever.    [name] Oil 1000 MG CAPS Take 1 capsule by mouth daily.    [name] levonorgestrel-ethinyl estradiol (AVIANE) 0.1-20 MG-MCG tablet Take 1 tablet by mouth daily.    . Probiotic Product (PROBIOTIC-10 PO) Take 1 capsule by mouth daily.    . Vitamin D, Ergocalciferol, (DRISDOL) 1.25 MG (50000 UT) CAPS capsule Take 1 capsule (50,000 Units total) by mouth every 7 (seven) days. 4 capsule 0   No current facility-administered medications on file prior to visit.     PAST MEDICAL HISTORY: Past Medical History:  Diagnosis Date  . Back pain   . Joint pain   . Kidney problem   . Swallowing difficulty     PAST SURGICAL HISTORY: Past Surgical History:  Procedure Laterality Date  . CESAREAN SECTION     x2    SOCIAL HISTORY: Social History   Tobacco Use  . Smoking status: Never Smoker  . Smokeless tobacco: Never Used  Substance Use Topics  . Alcohol use: Not Currently  . Drug use: Never    FAMILY HISTORY: History reviewed. No pertinent family history.  ROS: Review of Systems  Constitutional: Positive for weight loss.  Endo/Heme/Allergies:       Positive for polyphagia    PHYSICAL EXAM: Pt in no acute distress  RECENT LABS AND TESTS: BMET    Component Value Date/Time   NA 141 06/01/2019 1246   K 3.8 06/01/2019 1246   CL 104 06/01/2019 1246   CO2 23 06/01/2019 1246   GLUCOSE 85 06/01/2019 1246   BUN 12 06/01/2019 1246   CREATININE 0.85 06/01/2019 1246   CALCIUM 9.2 06/01/2019 1246   GFRNONAA 85 06/01/2019 1246   GFRAA 98 06/01/2019 1246   Lab Results  Component Value Date   HGBA1C 5.3 09/08/2018   Lab Results  Component Value Date   INSULIN 10.0 06/01/2019   INSULIN 18.7 09/08/2018   CBC    Component Value Date/Time   WBC 9.1 06/01/2019 1246   WBC 8.9 07/07/2008 0505   RBC 4.69 06/01/2019 1246   RBC 3.90 07/07/2008 0505   HGB 13.3 06/01/2019 1246   HCT 40.4 06/01/2019 1246   PLT 368 06/01/2019 1246   MCV 86 06/01/2019  1246   MCH 28.4 06/01/2019 1246   MCHC 32.9 06/01/2019 1246   MCHC 34.2 07/07/2008 0505   RDW 12.3 06/01/2019 1246   LYMPHSABS 1.7 06/01/2019 1246   EOSABS 0.0 06/01/2019 1246   BASOSABS 0.1 06/01/2019 1246   Iron/TIBC/Ferritin/ %Sat No results found for: IRON, TIBC, FERRITIN, IRONPCTSAT Lipid Panel     Component Value Date/Time   CHOL 199 06/01/2019 1246   TRIG 98 06/01/2019 1246   HDL 63 06/01/2019 1246   LDLCALC 119 (H) 06/01/2019 1246   Hepatic Function Panel     Component Value Date/Time   PROT 7.0 06/01/2019 1246   ALBUMIN 4.4 06/01/2019 1246   AST 16 06/01/2019 1246   ALT 18 06/01/2019 1246   ALKPHOS 87 06/01/2019 1246   BILITOT <0.2 06/01/2019 1246  Component Value Date/Time   TSH 0.640 09/08/2018 1207     Ref. Range 06/01/2019 12:46  Vitamin D, 25-Hydroxy Latest Ref Range: 30.0 - 100.0 ng/mL 46.0    I, [place] CraneJoanne Murray, am acting as Energy managertranscriptionist for [place] Gerad Cornelio, FNP-C.  I have reviewed the above documentation for accuracy and completeness, and I agree with the above.  - Johana Hopkinson, FNP-C.

## 2019-06-18 DIAGNOSIS — E8881 Metabolic syndrome: Secondary | ICD-10-CM | POA: Insufficient documentation

## 2019-06-18 DIAGNOSIS — E88819 Insulin resistance, unspecified: Secondary | ICD-10-CM | POA: Insufficient documentation

## 2019-06-23 ENCOUNTER — Telehealth (INDEPENDENT_AMBULATORY_CARE_PROVIDER_SITE_OTHER): Payer: BC Managed Care – PPO | Admitting: Family Medicine

## 2019-06-30 ENCOUNTER — Telehealth (INDEPENDENT_AMBULATORY_CARE_PROVIDER_SITE_OTHER): Payer: BC Managed Care – PPO | Admitting: Family Medicine

## 2019-06-30 ENCOUNTER — Encounter (INDEPENDENT_AMBULATORY_CARE_PROVIDER_SITE_OTHER): Payer: Self-pay | Admitting: Family Medicine

## 2019-06-30 ENCOUNTER — Other Ambulatory Visit: Payer: Self-pay

## 2019-06-30 DIAGNOSIS — E7849 Other hyperlipidemia: Secondary | ICD-10-CM | POA: Diagnosis not present

## 2019-06-30 DIAGNOSIS — E8881 Metabolic syndrome: Secondary | ICD-10-CM

## 2019-06-30 DIAGNOSIS — F3289 Other specified depressive episodes: Secondary | ICD-10-CM

## 2019-06-30 DIAGNOSIS — Z6834 Body mass index (BMI) 34.0-34.9, adult: Secondary | ICD-10-CM

## 2019-06-30 DIAGNOSIS — E669 Obesity, unspecified: Secondary | ICD-10-CM

## 2019-06-30 DIAGNOSIS — E559 Vitamin D deficiency, unspecified: Secondary | ICD-10-CM

## 2019-06-30 MED ORDER — VITAMIN D (ERGOCALCIFEROL) 1.25 MG (50000 UNIT) PO CAPS: 50000.0000 [IU] | ORAL_CAPSULE | ORAL | 0 refills | Status: DC

## 2019-06-30 MED ORDER — METFORMIN HCL 500 MG PO TABS: 500.0000 mg | ORAL_TABLET | Freq: Every day | ORAL | 0 refills | Status: DC

## 2019-07-02 NOTE — Progress Notes (Signed)
Office: [phone]  /  Fax: [phone] TeleHealth Visit:  [name] has verbally consented to this TeleHealth visit today. The patient is located in the car, the provider is located at the [facility] office. The participants in this visit include the listed provider, patient, and "Josh." The visit was conducted today via [email or web address].  HPI:  Chief Complaint: OBESITY [name] is here to discuss her progress with her obesity treatment plan. She is on the Category 3 plan and states she is following her eating plan approximately 70% of the time. She states she is walking 30 minutes 5 times per week.  [name] was started on metformin at her last visit and she states this is helping with polyphagia. She is able to continue meeting her protein goals. [name]'s video visit was due to COVID restrictions.  Today's visit was #19 Starting weight: 232 lbs Starting date: 09/08/2018  Insulin Resistance [name] was started on metformin at her last visit and she is tolerating this without side effects. It is helping with polyphagia/cravings.  Hyperlipidemia [name] has a diagnosis of hyperlipidemia. Recent labs were noted to be much improved.  Lab Results  Component Value Date   CHOL 199 06/01/2019   HDL 63 06/01/2019   LDLCALC 119 (H) 06/01/2019   TRIG 98 06/01/2019   Lab Results  Component Value Date   ALT 18 06/01/2019   AST 16 06/01/2019   ALKPHOS 87 06/01/2019   BILITOT <0.2 06/01/2019   Emotional Eating [name] has a diagnosis of emotional eating and is taking Wellbutrin. This is stable.  Vitamin D deficiency [name] has a diagnosis of Vitamin D deficiency and is taking prescription Vitamin D.  ASSESSMENT AND PLAN:  Insulin resistance - Plan: metFORMIN (GLUCOPHAGE) 500 MG tablet  Other hyperlipidemia  Vitamin D deficiency - Plan: Vitamin D, Ergocalciferol, (DRISDOL) 1.25 MG (50000 UT) CAPS capsule  Other depression,with emtional eating   Class 1  obesity with serious comorbidity and body mass index (BMI) of 34.0 to 34.9 in adult, unspecified obesity type  PLAN:  Insulin Resistance [name] will continue to work on weight loss, exercise, and decreasing simple carbohydrates to help decrease the risk of diabetes. [name] was given a prescription for metformin 500 mg PO daily #30 with 0 refills and she agreed to follow-up with [place] as directed to closely monitor her progress.  Hyperlipidemia Intensive lifestyle modifications as the first line treatment for hyperlipidemia. We discussed many lifestyle modifications today and [name] will continue to work on diet, exercise and weight loss efforts. We will monitor.  Emotional Eating We will monitor. Continue Wellbutrin.  Vitamin D Deficiency [name] was informed that low Vitamin D levels contributes to fatigue and are associated with obesity, breast, and colon cancer. She agrees to continue to take prescription Vit D @ 50,000 IU every week #4 with 0 refills and will follow-up for routine testing of Vitamin D, at least 2-3 times per year. She was informed of the risk of over-replacement of Vitamin D and agrees to not increase her dose unless she discusses this with [place] first. [name] agrees to follow-up with our clinic in 3 weeks.  Obesity [name] is currently in the action stage of change. As such, her goal is to continue with weight loss efforts. She has agreed to follow the Category 3 plan. [name] has been instructed to work up to a goal of 150 minutes of combined cardio and strengthening exercise per week for weight loss and overall health benefits. We discussed the following  Behavioral Modification Strategies today: increasing lean protein intake, decreasing simple carbohydrates, increase H20 intake, and planning for success.  Christy SetaHeather has agreed to follow-up with our clinic in 3 weeks. She was informed of the importance of frequent follow-up visits to maximize her success with intensive  lifestyle modifications for her multiple health conditions.  ALLERGIES: Allergies  Allergen Reactions  . Sulfa Antibiotics Rash    MEDICATIONS: Current Outpatient Medications on File Prior to Visit  Medication Sig Dispense Refill  . buPROPion (WELLBUTRIN SR) 150 MG 12 hr tablet Take 1 tablet (150 mg total) by mouth 2 (two) times daily. 60 tablet 0  . Cetirizine HCl (ZYRTEC ALLERGY) 10 MG CAPS Take 1 capsule by mouth daily.    . Cranberry 1000 MG CAPS Take 1 capsule by mouth daily.    [name]. ibuprofen (ADVIL,MOTRIN) 100 MG tablet Take 100 mg by mouth every 6 (six) hours as needed for fever.    [name]. Krill Oil 1000 MG CAPS Take 1 capsule by mouth daily.    [name]. levonorgestrel-ethinyl estradiol (AVIANE) 0.1-20 MG-MCG tablet Take 1 tablet by mouth daily.    . Probiotic Product (PROBIOTIC-10 PO) Take 1 capsule by mouth daily.     No current facility-administered medications on file prior to visit.    PAST MEDICAL HISTORY: Past Medical History:  Diagnosis Date  . Back pain   . Joint pain   . Kidney problem   . Swallowing difficulty     PAST SURGICAL HISTORY: Past Surgical History:  Procedure Laterality Date  . CESAREAN SECTION     x2    SOCIAL HISTORY: Social History   Tobacco Use  . Smoking status: Never Smoker  . Smokeless tobacco: Never Used  Substance Use Topics  . Alcohol use: Not Currently  . Drug use: Never    FAMILY HISTORY: History reviewed. No pertinent family history.  ROS: ROS none noted.  PHYSICAL EXAM: There were no vitals taken for this visit. There is no height or weight on file to calculate BMI. Physical Exam:  VITALS: Per patient if applicable, see vitals. GENERAL: Alert and in no acute distress. CARDIOPULMONARY: No increased WOB. Speaking in clear sentences.  PSYCH: Pleasant and cooperative. Speech normal rate and rhythm. Affect is appropriate. Insight and judgement are appropriate. Attention is focused, linear, and appropriate.  NEURO: Oriented as  arrived to appointment on time with no prompting.   RECENT LABS AND TESTS: BMET    Component Value Date/Time   NA 141 06/01/2019 1246   K 3.8 06/01/2019 1246   CL 104 06/01/2019 1246   CO2 23 06/01/2019 1246   GLUCOSE 85 06/01/2019 1246   BUN 12 06/01/2019 1246   CREATININE 0.85 06/01/2019 1246   CALCIUM 9.2 06/01/2019 1246   GFRNONAA 85 06/01/2019 1246   GFRAA 98 06/01/2019 1246   Lab Results  Component Value Date   HGBA1C 5.3 09/08/2018   Lab Results  Component Value Date   INSULIN 10.0 06/01/2019   INSULIN 18.7 09/08/2018   CBC    Component Value Date/Time   WBC 9.1 06/01/2019 1246   WBC 8.9 07/07/2008 0505   RBC 4.69 06/01/2019 1246   RBC 3.90 07/07/2008 0505   HGB 13.3 06/01/2019 1246   HCT 40.4 06/01/2019 1246   PLT 368 06/01/2019 1246   MCV 86 06/01/2019 1246   MCH 28.4 06/01/2019 1246   MCHC 32.9 06/01/2019 1246   MCHC 34.2 07/07/2008 0505   RDW 12.3 06/01/2019 1246   LYMPHSABS 1.7 06/01/2019 1246  EOSABS 0.0 06/01/2019 1246   BASOSABS 0.1 06/01/2019 1246   Iron/TIBC/Ferritin/ %Sat No results found for: IRON, TIBC, FERRITIN, IRONPCTSAT Lipid Panel     Component Value Date/Time   CHOL 199 06/01/2019 1246   TRIG 98 06/01/2019 1246   HDL 63 06/01/2019 1246   LDLCALC 119 (H) 06/01/2019 1246   Hepatic Function Panel     Component Value Date/Time   PROT 7.0 06/01/2019 1246   ALBUMIN 4.4 06/01/2019 1246   AST 16 06/01/2019 1246   ALT 18 06/01/2019 1246   ALKPHOS 87 06/01/2019 1246   BILITOT <0.2 06/01/2019 1246      Component Value Date/Time   TSH 0.640 09/08/2018 1207    I, Marianna Payment, am acting as Energy manager for [employer]. Earlene Plater, DO  I have reviewed the above documentation for accuracy and completeness, and I agree with the above. Helane Rima, DO

## 2019-07-06 ENCOUNTER — Other Ambulatory Visit: Payer: Self-pay | Admitting: Obstetrics and Gynecology

## 2019-07-06 DIAGNOSIS — N6310 Unspecified lump in the right breast, unspecified quadrant: Secondary | ICD-10-CM

## 2019-07-06 DIAGNOSIS — R921 Mammographic calcification found on diagnostic imaging of breast: Secondary | ICD-10-CM

## 2019-07-15 ENCOUNTER — Other Ambulatory Visit (INDEPENDENT_AMBULATORY_CARE_PROVIDER_SITE_OTHER): Payer: Self-pay | Admitting: Family Medicine

## 2019-07-15 DIAGNOSIS — F3289 Other specified depressive episodes: Secondary | ICD-10-CM

## 2019-07-22 ENCOUNTER — Encounter (INDEPENDENT_AMBULATORY_CARE_PROVIDER_SITE_OTHER): Payer: Self-pay | Admitting: Family Medicine

## 2019-07-22 ENCOUNTER — Ambulatory Visit (INDEPENDENT_AMBULATORY_CARE_PROVIDER_SITE_OTHER): Payer: BC Managed Care – PPO | Admitting: Family Medicine

## 2019-07-22 ENCOUNTER — Other Ambulatory Visit: Payer: Self-pay

## 2019-07-22 NOTE — Telephone Encounter (Signed)
Please advise 

## 2019-07-23 ENCOUNTER — Ambulatory Visit
Admission: RE | Admit: 2019-07-23 | Discharge: 2019-07-23 | Disposition: A | Discharge status: Home / Self Care | Payer: BC Managed Care – PPO | Source: Ambulatory Visit | Attending: Obstetrics and Gynecology | Admitting: Obstetrics and Gynecology

## 2019-07-23 ENCOUNTER — Other Ambulatory Visit (INDEPENDENT_AMBULATORY_CARE_PROVIDER_SITE_OTHER): Payer: Self-pay

## 2019-07-23 ENCOUNTER — Other Ambulatory Visit: Payer: Self-pay

## 2019-07-23 DIAGNOSIS — F3289 Other specified depressive episodes: Secondary | ICD-10-CM

## 2019-07-23 DIAGNOSIS — E8881 Metabolic syndrome: Secondary | ICD-10-CM

## 2019-07-23 DIAGNOSIS — N6310 Unspecified lump in the right breast, unspecified quadrant: Secondary | ICD-10-CM

## 2019-07-23 MED ORDER — BUPROPION HCL ER (SR) 150 MG PO TB12: 150.0000 mg | ORAL_TABLET | Freq: Two times a day (BID) | ORAL | 0 refills | Status: DC

## 2019-07-23 MED ORDER — METFORMIN HCL 500 MG PO TABS: 500.0000 mg | ORAL_TABLET | Freq: Two times a day (BID) | ORAL | 0 refills | Status: DC

## 2019-07-30 ENCOUNTER — Encounter (INDEPENDENT_AMBULATORY_CARE_PROVIDER_SITE_OTHER): Payer: Self-pay | Admitting: Bariatrics

## 2019-07-30 ENCOUNTER — Telehealth (INDEPENDENT_AMBULATORY_CARE_PROVIDER_SITE_OTHER): Payer: BC Managed Care – PPO | Admitting: Bariatrics

## 2019-07-30 ENCOUNTER — Other Ambulatory Visit: Payer: Self-pay

## 2019-07-30 DIAGNOSIS — F3289 Other specified depressive episodes: Secondary | ICD-10-CM | POA: Diagnosis not present

## 2019-07-30 DIAGNOSIS — E7849 Other hyperlipidemia: Secondary | ICD-10-CM

## 2019-07-30 DIAGNOSIS — E669 Obesity, unspecified: Secondary | ICD-10-CM

## 2019-07-30 DIAGNOSIS — Z6834 Body mass index (BMI) 34.0-34.9, adult: Secondary | ICD-10-CM

## 2019-08-03 NOTE — Progress Notes (Signed)
TeleHealth Visit:  Due to the COVID-19 pandemic, this visit was completed with telemedicine (audio/video) technology to reduce patient and provider exposure as well as to preserve personal protective equipment.   [name] has verbally consented to this TeleHealth visit. The patient is located at home, the provider is located at the [facility] office. The participants in this visit include the listed provider and patient. The visit was conducted today via FaceTime.  Chief Complaint: OBESITY [name] is here to discuss her progress with her obesity treatment plan along with follow-up of her obesity related diagnoses. [name] is on the Category 3 Plan and states she is following her eating plan approximately 40% of the time. [name] states she is exercising 0 minutes 0 times per week.  Today's visit was #: 20 Starting weight: 232 lbs Starting date: 09/08/2018  Interim History: [name] normally sees Dr. Adair Patter or Jake Bathe, NP. She states that her weight is up 3 lbs. She reports having had more carbohydrates and sweets. She is drinking more water.  Subjective:   Other depression. [name] is struggling with emotional eating and using food for comfort to the extent that it is negatively impacting her health. She has been working on behavior modification techniques to help reduce her emotional eating and has been somewhat successful. She shows no sign of suicidal or homicidal ideations. She is taking Wellbutrin and reports having improved symptoms.  Other hyperlipidemia.  [name] has hyperlipidemia and has been trying to improve her cholesterol levels with intensive lifestyle modification including a low saturated fat diet, exercise and weight loss. She denies any chest pain, claudication or myalgias. She is on no medications.  Lab Results  Component Value Date   ALT 18 06/01/2019   AST 16 06/01/2019   ALKPHOS 87 06/01/2019   BILITOT <0.2 06/01/2019   Lab  Results  Component Value Date   CHOL 199 06/01/2019   HDL 63 06/01/2019   LDLCALC 119 (H) 06/01/2019   TRIG 98 06/01/2019   Assessment/Plan:   Other depression. Behavior modification techniques were discussed today to help [name] deal with her emotional/non-hunger eating behaviors.  Orders and follow up as documented in patient record. She will continue Wellbutrin.  Other hyperlipidemia. Cardiovascular risk and specific lipid/LDL goals reviewed.  We discussed several lifestyle modifications today and [name] will continue to work on diet, exercise and weight loss efforts. Orders and follow up as documented in patient record. She will decrease saturated fats and increase PUFA's and MUFA's.  Counseling Intensive lifestyle modifications are the first line treatment for this issue. . Dietary changes: Increase soluble fiber. Decrease simple carbohydrates. . Exercise changes: Moderate to vigorous-intensity aerobic activity 150 minutes per week if tolerated. . Lipid-lowering medications: see documented in medical record.  Class 1 obesity with serious comorbidity and body mass index (BMI) of 34.0 to 34.9 in adult, unspecified obesity type.  [name] is currently in the action stage of change. As such, her goal is to continue with weight loss efforts. She has agreed to the Category 3 Plan.   She will work on meal planning, intentional eating, and decrease carbohydrates and sweets.  Exercise goals: [name] will increase exercise (some walking), boxing classes, and [employer] exercise videos.  Behavioral modification strategies: increasing lean protein intake, decreasing simple carbohydrates, increasing vegetables, increasing water intake, decreasing eating out, no skipping meals and meal planning and cooking strategies.  [name] has agreed to follow-up with our clinic in 2 weeks. She was informed of the importance  of frequent follow-up visits to maximize her success with intensive lifestyle  modifications for her multiple health conditions.  Objective:   VITALS: Per patient if applicable, see vitals. GENERAL: Alert and in no acute distress. CARDIOPULMONARY: No increased WOB. Speaking in clear sentences.  PSYCH: Pleasant and cooperative. Speech normal rate and rhythm. Affect is appropriate. Insight and judgement are appropriate. Attention is focused, linear, and appropriate.  NEURO: Oriented as arrived to appointment on time with no prompting.   Lab Results  Component Value Date   CREATININE 0.85 06/01/2019   BUN 12 06/01/2019   NA 141 06/01/2019   K 3.8 06/01/2019   CL 104 06/01/2019   CO2 23 06/01/2019   Lab Results  Component Value Date   ALT 18 06/01/2019   AST 16 06/01/2019   ALKPHOS 87 06/01/2019   BILITOT <0.2 06/01/2019   Lab Results  Component Value Date   HGBA1C 5.3 09/08/2018   Lab Results  Component Value Date   INSULIN 10.0 06/01/2019   INSULIN 18.7 09/08/2018   Lab Results  Component Value Date   TSH 0.640 09/08/2018   Lab Results  Component Value Date   CHOL 199 06/01/2019   HDL 63 06/01/2019   LDLCALC 119 (H) 06/01/2019   TRIG 98 06/01/2019   Lab Results  Component Value Date   WBC 9.1 06/01/2019   HGB 13.3 06/01/2019   HCT 40.4 06/01/2019   MCV 86 06/01/2019   PLT 368 06/01/2019    Attestation Statements:   Reviewed by clinician on day of visit: allergies, medications, problem list, medical history, surgical history, family history, social history, and previous encounter notes.  Time spent on visit including pre-visit chart review and post-visit care was 20 minutes.   [name], am acting as Energy manager for [employer], DO   I have reviewed the above documentation for accuracy and completeness, and I agree with the above. Corinna Capra, DO

## 2019-08-18 ENCOUNTER — Telehealth (INDEPENDENT_AMBULATORY_CARE_PROVIDER_SITE_OTHER): Payer: BC Managed Care – PPO | Admitting: Bariatrics

## 2019-08-18 ENCOUNTER — Other Ambulatory Visit: Payer: Self-pay

## 2019-08-18 ENCOUNTER — Encounter (INDEPENDENT_AMBULATORY_CARE_PROVIDER_SITE_OTHER): Payer: Self-pay | Admitting: Bariatrics

## 2019-08-18 DIAGNOSIS — Z6834 Body mass index (BMI) 34.0-34.9, adult: Secondary | ICD-10-CM

## 2019-08-18 DIAGNOSIS — F3289 Other specified depressive episodes: Secondary | ICD-10-CM | POA: Diagnosis not present

## 2019-08-18 DIAGNOSIS — E559 Vitamin D deficiency, unspecified: Secondary | ICD-10-CM | POA: Diagnosis not present

## 2019-08-18 DIAGNOSIS — E8881 Metabolic syndrome: Secondary | ICD-10-CM

## 2019-08-18 DIAGNOSIS — E669 Obesity, unspecified: Secondary | ICD-10-CM | POA: Diagnosis not present

## 2019-08-18 MED ORDER — METFORMIN HCL 500 MG PO TABS: 500.0000 mg | ORAL_TABLET | Freq: Two times a day (BID) | ORAL | 0 refills | Status: DC

## 2019-08-18 MED ORDER — BUPROPION HCL ER (SR) 150 MG PO TB12: 150.0000 mg | ORAL_TABLET | Freq: Two times a day (BID) | ORAL | 0 refills | Status: DC

## 2019-08-18 MED ORDER — VITAMIN D (ERGOCALCIFEROL) 1.25 MG (50000 UNIT) PO CAPS: 50000.0000 [IU] | ORAL_CAPSULE | ORAL | 0 refills | Status: DC

## 2019-08-18 NOTE — Progress Notes (Signed)
TeleHealth Visit:  Due to the COVID-19 pandemic, this visit was completed with telemedicine (audio/video) technology to reduce patient and provider exposure as well as to preserve personal protective equipment.   [name] has verbally consented to this TeleHealth visit. The patient is located at home, the provider is located at the [facility] office. The participants in this visit include the listed provider and patient. The visit was conducted today via FaceTime.  Chief Complaint: OBESITY [name] is here to discuss her progress with her obesity treatment plan along with follow-up of her obesity related diagnoses. [name] is on the Category 3 Plan and states she is following her eating plan approximately 75% of the time. [name] states she is exercising 0 minutes 0 times per week.  Today's visit was #: 21 Starting weight: 232 lbs Starting date: 09/08/2018   Interim History: [name] states that she is down 1 lb. She reports doing well with her water intake.  Subjective:   Other depression - with emotional eating. [name] is struggling with emotional eating and using food for comfort to the extent that it is negatively impacting her health. She has been working on behavior modification techniques to help reduce her emotional eating and has been somewhat successful. She shows no sign of suicidal or homicidal ideations. Daily reports Wellbutrin is helping with her emotional eating.  Insulin resistance. [name] has a diagnosis of insulin resistance based on her elevated fasting insulin level >5. She continues to work on diet and exercise to decrease her risk of diabetes. Appetite is normal. She denies skipping meals.  Lab Results  Component Value Date   INSULIN 10.0 06/01/2019   INSULIN 18.7 09/08/2018   Lab Results  Component Value Date   HGBA1C 5.3 09/08/2018   Vitamin D deficiency. No nausea, vomiting, or muscle weakness. Last Vitamin D level 46.0 on  06/01/2019.  Assessment/Plan:   Other depression - with emotional eating. Behavior modification techniques were discussed today to help [name] deal with her emotional/non-hunger eating behaviors.  Orders and follow up as documented in patient record. She was given a prescription for buPROPion (WELLBUTRIN SR) 150 MG 12 hr tablet 1 PO BID #60 with 0 refills.  Insulin resistance. [name] will continue to work on weight loss, exercise, and decreasing simple carbohydrates to help decrease the risk of diabetes. [name] agreed to follow-up with [name] as directed to closely monitor her progress. She was given a prescription for metFORMIN (GLUCOPHAGE) 500 MG tablet 1 BID with meals #60 with 0 refills.  Vitamin D deficiency. Low Vitamin D level contributes to fatigue and are associated with obesity, breast, and colon cancer. She was given a prescription for Vitamin D, Ergocalciferol, (DRISDOL) 1.25 MG (50000 UNIT) CAPS capsule every week #4 with 0 refills and will follow-up for routine testing of Vitamin D, at least 2-3 times per year to avoid over-replacement.   Class 1 obesity with serious comorbidity and body mass index (BMI) of 34.0 to 34.9 in adult, unspecified obesity type.  [name] is currently in the action stage of change. As such, her goal is to continue with weight loss efforts. She has agreed to the Category 3 Plan.   She will work on meal planning, intentional eating, and will weigh herself at home before each visit.  Exercise goals: [name] will walk and use light weights.  Behavioral modification strategies: increasing lean protein intake, decreasing simple carbohydrates, increasing vegetables, increasing water intake, decreasing eating out, no skipping meals, meal planning and cooking  strategies, keeping healthy foods in the home and planning for success.  [name] has agreed to follow-up with our clinic in 2 weeks. She was informed of the importance of frequent follow-up visits to maximize  her success with intensive lifestyle modifications for her multiple health conditions.  Objective:   VITALS: Per patient if applicable, see vitals. GENERAL: Alert and in no acute distress. CARDIOPULMONARY: No increased WOB. Speaking in clear sentences.  PSYCH: Pleasant and cooperative. Speech normal rate and rhythm. Affect is appropriate. Insight and judgement are appropriate. Attention is focused, linear, and appropriate.  NEURO: Oriented as arrived to appointment on time with no prompting.   Lab Results  Component Value Date   CREATININE 0.85 06/01/2019   BUN 12 06/01/2019   NA 141 06/01/2019   K 3.8 06/01/2019   CL 104 06/01/2019   CO2 23 06/01/2019   Lab Results  Component Value Date   ALT 18 06/01/2019   AST 16 06/01/2019   ALKPHOS 87 06/01/2019   BILITOT <0.2 06/01/2019   Lab Results  Component Value Date   HGBA1C 5.3 09/08/2018   Lab Results  Component Value Date   INSULIN 10.0 06/01/2019   INSULIN 18.7 09/08/2018   Lab Results  Component Value Date   TSH 0.640 09/08/2018   Lab Results  Component Value Date   CHOL 199 06/01/2019   HDL 63 06/01/2019   LDLCALC 119 (H) 06/01/2019   TRIG 98 06/01/2019   Lab Results  Component Value Date   WBC 9.1 06/01/2019   HGB 13.3 06/01/2019   HCT 40.4 06/01/2019   MCV 86 06/01/2019   PLT 368 06/01/2019   No results found for: IRON, TIBC, FERRITIN  Attestation Statements:   Reviewed by clinician on day of visit: allergies, medications, problem list, medical history, surgical history, family history, social history, and previous encounter notes.  Migdalia Dk, am acting as Location manager for [employer], DO   I have reviewed the above documentation for accuracy and completeness, and I agree with the above. Jearld Lesch, DO

## 2019-09-01 ENCOUNTER — Encounter (INDEPENDENT_AMBULATORY_CARE_PROVIDER_SITE_OTHER): Payer: Self-pay | Admitting: Bariatrics

## 2019-09-01 ENCOUNTER — Telehealth (INDEPENDENT_AMBULATORY_CARE_PROVIDER_SITE_OTHER): Payer: BC Managed Care – PPO | Admitting: Bariatrics

## 2019-09-01 ENCOUNTER — Other Ambulatory Visit: Payer: Self-pay

## 2019-09-01 DIAGNOSIS — F3289 Other specified depressive episodes: Secondary | ICD-10-CM | POA: Diagnosis not present

## 2019-09-01 DIAGNOSIS — Z6837 Body mass index (BMI) 37.0-37.9, adult: Secondary | ICD-10-CM | POA: Diagnosis not present

## 2019-09-01 DIAGNOSIS — E8881 Metabolic syndrome: Secondary | ICD-10-CM | POA: Diagnosis not present

## 2019-09-01 NOTE — Progress Notes (Signed)
TeleHealth Visit:  Due to the COVID-19 pandemic, this visit was completed with telemedicine (audio/video) technology to reduce patient and provider exposure as well as to preserve personal protective equipment.   JELIYAH MIDDLEBROOKS has verbally consented to this TeleHealth visit. The patient is located at home, the provider is located at the [facility] office. The participants in this visit include the listed provider and patient. The visit was conducted today via FaceTime.  Chief Complaint: OBESITY Charmaine is here to discuss her progress with her obesity treatment plan along with follow-up of her obesity related diagnoses. Marilu is on the Category 3 Plan and states she is following her eating plan approximately 75% of the time. Mattison states she is walking 10-15 minutes 5 times per week.  Today's visit was #: 22 Starting weight: 232 lbs Starting date: 09/08/2018  Interim History: Mitchelle states that her weight remains the same. She denies struggles. She reports getting adequate water intake.   Subjective:   Insulin resistance. Aunya has a diagnosis of insulin resistance based on her elevated fasting insulin level >5. She continues to work on diet and exercise to decrease her risk of diabetes. Katelynn is taking metformin. Appetite is controlled.  Lab Results  Component Value Date   INSULIN 10.0 06/01/2019   INSULIN 18.7 09/08/2018   Lab Results  Component Value Date   HGBA1C 5.3 09/08/2018   Other depression, with emotional eating. Margrette is struggling with emotional eating and using food for comfort to the extent that it is negatively impacting her health. She has been working on behavior modification techniques to help reduce her emotional eating and has been somewhat successful. She shows no sign of suicidal or homicidal ideations. Seham is taking Wellbutrin and reports no stress eating.  Assessment/Plan:   Insulin resistance. Ilean will continue to  work on weight loss, exercise, and decreasing simple carbohydrates to help decrease the risk of diabetes. Bryella agreed to follow-up with [place] as directed to closely monitor her progress. She will continue metformin as prescribed.  Other depression, with emotional eating. Behavior modification techniques were discussed today to help Tressa deal with her emotional/non-hunger eating behaviors.  Orders and follow up as documented in patient record. Liliyana will continue her medication as prescribed.  Class 2 severe obesity with serious comorbidity and body mass index (BMI) of 37.0 to 37.9 in adult, unspecified obesity type ([place]).  Nasra is currently in the action stage of change. As such, her goal is to continue with weight loss efforts. She has agreed to the Category 3 Plan.   She will work on meal planning and intentional eating.  Exercise goals: Dossie will continue to walk, is active at work, and will start small weights.  Behavioral modification strategies: increasing lean protein intake, decreasing simple carbohydrates, increasing vegetables, increasing water intake, decreasing eating out, no skipping meals, meal planning and cooking strategies, keeping healthy foods in the home and planning for success.  Daven has agreed to follow-up with our clinic in 2 weeks. She was informed of the importance of frequent follow-up visits to maximize her success with intensive lifestyle modifications for her multiple health conditions.  Objective:   VITALS: Per patient if applicable, see vitals. GENERAL: Alert and in no acute distress. CARDIOPULMONARY: No increased WOB. Speaking in clear sentences.  PSYCH: Pleasant and cooperative. Speech normal rate and rhythm. Affect is appropriate. Insight and judgement are appropriate. Attention is focused, linear, and appropriate.  NEURO: Oriented as arrived to appointment on time  with no prompting.   Lab Results  Component Value Date   CREATININE 0.85  06/01/2019   BUN 12 06/01/2019   NA 141 06/01/2019   K 3.8 06/01/2019   CL 104 06/01/2019   CO2 23 06/01/2019   Lab Results  Component Value Date   ALT 18 06/01/2019   AST 16 06/01/2019   ALKPHOS 87 06/01/2019   BILITOT <0.2 06/01/2019   Lab Results  Component Value Date   HGBA1C 5.3 09/08/2018   Lab Results  Component Value Date   INSULIN 10.0 06/01/2019   INSULIN 18.7 09/08/2018   Lab Results  Component Value Date   TSH 0.640 09/08/2018   Lab Results  Component Value Date   CHOL 199 06/01/2019   HDL 63 06/01/2019   LDLCALC 119 (H) 06/01/2019   TRIG 98 06/01/2019   Lab Results  Component Value Date   WBC 9.1 06/01/2019   HGB 13.3 06/01/2019   HCT 40.4 06/01/2019   MCV 86 06/01/2019   PLT 368 06/01/2019   No results found for: IRON, TIBC, FERRITIN  Attestation Statements:   Reviewed by clinician on day of visit: allergies, medications, problem list, medical history, surgical history, family history, social history, and previous encounter notes.  Time spent on visit including pre-visit chart review and post-visit care was 20 minutes.   [name], am acting as Energy manager for [employer], DO   I have reviewed the above documentation for accuracy and completeness, and I agree with the above. Corinna Capra, DO

## 2019-09-15 ENCOUNTER — Telehealth (INDEPENDENT_AMBULATORY_CARE_PROVIDER_SITE_OTHER): Payer: BC Managed Care – PPO | Admitting: Bariatrics

## 2019-09-24 ENCOUNTER — Other Ambulatory Visit: Payer: Self-pay

## 2019-09-24 ENCOUNTER — Telehealth (INDEPENDENT_AMBULATORY_CARE_PROVIDER_SITE_OTHER): Payer: BC Managed Care – PPO | Admitting: Bariatrics

## 2019-09-24 DIAGNOSIS — F3289 Other specified depressive episodes: Secondary | ICD-10-CM

## 2019-09-24 DIAGNOSIS — F5089 Other specified eating disorder: Secondary | ICD-10-CM | POA: Diagnosis not present

## 2019-09-24 DIAGNOSIS — E669 Obesity, unspecified: Secondary | ICD-10-CM | POA: Diagnosis not present

## 2019-09-24 DIAGNOSIS — E8881 Metabolic syndrome: Secondary | ICD-10-CM | POA: Diagnosis not present

## 2019-09-24 MED ORDER — METFORMIN HCL 500 MG PO TABS: 500.0000 mg | ORAL_TABLET | Freq: Two times a day (BID) | ORAL | 0 refills | Status: DC

## 2019-09-24 MED ORDER — BUPROPION HCL ER (SR) 150 MG PO TB12: 150.0000 mg | ORAL_TABLET | Freq: Two times a day (BID) | ORAL | 0 refills | Status: DC

## 2019-09-24 NOTE — Progress Notes (Signed)
TeleHealth Visit:  Due to the COVID-19 pandemic, this visit was completed with telemedicine (audio/video) technology to reduce patient and provider exposure as well as to preserve personal protective equipment.   [name] has verbally consented to this TeleHealth visit. The patient is located at home, the provider is located at the [facility] office. The participants in this visit include the listed provider and patient. The visit was conducted today via FaceTime.  Chief Complaint: OBESITY [name] is here to discuss her progress with her obesity treatment plan along with follow-up of her obesity related diagnoses. [name] is on the Category 3 Plan and states she is following her eating plan approximately 75% of the time. [name] states she is exercising 0 minutes 0 times per week.  Today's visit was #: 23 Starting weight: 232 lbs Starting date: 09/08/2018  Interim History: [name] states that she is down 2 lbs (weight 206). She did have COVID-19 and lost her sense of taste and smell.  Subjective:   Other depression - with emotional eating. [name] is struggling with emotional eating and using food for comfort to the extent that it is negatively impacting her health. She has been working on behavior modification techniques to help reduce her emotional eating and has been somewhat successful. She shows no sign of suicidal or homicidal ideations. [name] is taking Wellbutrin.  Insulin resistance. [name] has a diagnosis of insulin resistance based on her elevated fasting insulin level >5. She continues to work on diet and exercise to decrease her risk of diabetes. She is taking metformin.  Lab Results  Component Value Date   INSULIN 10.0 06/01/2019   INSULIN 18.7 09/08/2018   Lab Results  Component Value Date   HGBA1C 5.3 09/08/2018   Assessment/Plan:   Other depression - with emotional eating. Behavior modification techniques were discussed today to help  [name] deal with her emotional/non-hunger eating behaviors.  Orders and follow up as documented in patient record. [name] was given a prescription for buPROPion (WELLBUTRIN SR) 150 MG 12 hr tablet 1 PO BID #60 with 0 refills.  Insulin resistance. [name] will continue to work on weight loss, exercise, and decreasing simple carbohydrates to help decrease the risk of diabetes. [name] agreed to follow-up with [place] as directed to closely monitor her progress. [name] was given a prescription for metFORMIN (GLUCOPHAGE) 500 MG tablet 1 PO BID with meals #60 with 0 refills.  [name] is currently in the action stage of change. As such, her goal is to continue with weight loss efforts. She has agreed to the Category 3 Plan.   She will work on meal planning and intentional eating.  Exercise goals: [name] will increase her walking.  Behavioral modification strategies: increasing lean protein intake, decreasing simple carbohydrates, increasing vegetables, increasing water intake, decreasing eating out, no skipping meals, meal planning and cooking strategies and keeping healthy foods in the home.  [name] has agreed to follow-up with our clinic in 2 weeks. She was informed of the importance of frequent follow-up visits to maximize her success with intensive lifestyle modifications for her multiple health conditions.  Objective:   VITALS: Per patient if applicable, see vitals. GENERAL: Alert and in no acute distress. CARDIOPULMONARY: No increased WOB. Speaking in clear sentences.  PSYCH: Pleasant and cooperative. Speech normal rate and rhythm. Affect is appropriate. Insight and judgement are appropriate. Attention is focused, linear, and appropriate.  NEURO: Oriented as arrived to appointment on time with no prompting.   Lab Results  Component  Value Date   CREATININE 0.85 06/01/2019   BUN 12 06/01/2019   NA 141 06/01/2019   K 3.8 06/01/2019   CL 104 06/01/2019   CO2 23 06/01/2019   Lab Results    Component Value Date   ALT 18 06/01/2019   AST 16 06/01/2019   ALKPHOS 87 06/01/2019   BILITOT <0.2 06/01/2019   Lab Results  Component Value Date   HGBA1C 5.3 09/08/2018   Lab Results  Component Value Date   INSULIN 10.0 06/01/2019   INSULIN 18.7 09/08/2018   Lab Results  Component Value Date   TSH 0.640 09/08/2018   Lab Results  Component Value Date   CHOL 199 06/01/2019   HDL 63 06/01/2019   LDLCALC 119 (H) 06/01/2019   TRIG 98 06/01/2019   Lab Results  Component Value Date   WBC 9.1 06/01/2019   HGB 13.3 06/01/2019   HCT 40.4 06/01/2019   MCV 86 06/01/2019   PLT 368 06/01/2019   No results found for: IRON, TIBC, FERRITIN  Attestation Statements:   Reviewed by clinician on day of visit: allergies, medications, problem list, medical history, surgical history, family history, social history, and previous encounter notes.  Migdalia Dk, am acting as Location manager for [employer], DO   I have reviewed the above documentation for accuracy and completeness, and I agree with the above. Jearld Lesch, DO

## 2019-10-08 ENCOUNTER — Encounter (INDEPENDENT_AMBULATORY_CARE_PROVIDER_SITE_OTHER): Payer: Self-pay | Admitting: Bariatrics

## 2019-10-08 ENCOUNTER — Telehealth (INDEPENDENT_AMBULATORY_CARE_PROVIDER_SITE_OTHER): Payer: BC Managed Care – PPO | Admitting: Bariatrics

## 2019-10-08 ENCOUNTER — Other Ambulatory Visit: Payer: Self-pay

## 2019-10-08 DIAGNOSIS — E559 Vitamin D deficiency, unspecified: Secondary | ICD-10-CM | POA: Diagnosis not present

## 2019-10-08 DIAGNOSIS — Z6834 Body mass index (BMI) 34.0-34.9, adult: Secondary | ICD-10-CM | POA: Diagnosis not present

## 2019-10-08 DIAGNOSIS — E669 Obesity, unspecified: Secondary | ICD-10-CM | POA: Diagnosis not present

## 2019-10-08 DIAGNOSIS — E8881 Metabolic syndrome: Secondary | ICD-10-CM | POA: Diagnosis not present

## 2019-10-08 MED ORDER — VITAMIN D (ERGOCALCIFEROL) 1.25 MG (50000 UNIT) PO CAPS: 50000.0000 [IU] | ORAL_CAPSULE | ORAL | 0 refills | Status: DC

## 2019-10-08 NOTE — Progress Notes (Signed)
TeleHealth Visit:  Due to the COVID-19 pandemic, this visit was completed with telemedicine (audio/video) technology to reduce patient and provider exposure as well as to preserve personal protective equipment.   [name] has verbally consented to this TeleHealth visit. The patient is located at home, the provider is located at the [facility] office. The participants in this visit include the listed provider and patient. The visit was conducted today via FaceTime.  Chief Complaint: OBESITY [name] is here to discuss her progress with her obesity treatment plan along with follow-up of her obesity related diagnoses. [name] is on the Category 3 Plan and states she is following her eating plan approximately 70% of the time. [name] states she is exercising 0 minutes 0 times per week.  Today's visit was #: 24 Starting weight: 232 lbs Starting date: 09/08/2018  Interim History: [name] states that she is up 2 lbs (weight 208). She reports not doing well with her water.  Subjective:   Insulin resistance. [name] has a diagnosis of insulin resistance based on her elevated fasting insulin level >5. She continues to work on diet and exercise to decrease her risk of diabetes. [name] is taking metformin.  Lab Results  Component Value Date   INSULIN 10.0 06/01/2019   INSULIN 18.7 09/08/2018   Lab Results  Component Value Date   HGBA1C 5.3 09/08/2018   Vitamin D deficiency. No nausea, vomiting, or muscle weakness. Last Vitamin D 46.0 on 06/01/2019.  Assessment/Plan:   Insulin resistance. [name] will continue to work on weight loss, exercise, and decreasing simple carbohydrates to help decrease the risk of diabetes. [name] agreed to follow-up with [place] as directed to closely monitor her progress. She will continue her medication as directed.  Vitamin D deficiency. Low Vitamin D level contributes to fatigue and are associated with obesity, breast, and colon  cancer. She was given a prescription for Vitamin D, Ergocalciferol, (DRISDOL) 1.25 MG (50000 UNIT) CAPS capsule every week #4 with 0 refills and will follow-up for routine testing of Vitamin D, at least 2-3 times per year to avoid over-replacement.     Class 1 obesity with serious comorbidity and body mass index (BMI) of 34.0 to 34.9 in adult, unspecified obesity type.  [name] is currently in the action stage of change. As such, her goal is to continue with weight loss efforts. She has agreed to the Category 3 Plan.   She will work on meal planning, mindful eating, and increasing her water intake.  Exercise goals: [name] will walk several days per week.  Behavioral modification strategies: increasing lean protein intake, decreasing simple carbohydrates, increasing vegetables, increasing water intake, decreasing eating out, no skipping meals, meal planning and cooking strategies, keeping healthy foods in the home and planning for success.  [name] has agreed to follow-up with our clinic in 2 weeks. She was informed of the importance of frequent follow-up visits to maximize her success with intensive lifestyle modifications for her multiple health conditions.  Objective:   VITALS: Per patient if applicable, see vitals. GENERAL: Alert and in no acute distress. CARDIOPULMONARY: No increased WOB. Speaking in clear sentences.  PSYCH: Pleasant and cooperative. Speech normal rate and rhythm. Affect is appropriate. Insight and judgement are appropriate. Attention is focused, linear, and appropriate.  NEURO: Oriented as arrived to appointment on time with no prompting.   Lab Results  Component Value Date   CREATININE 0.85 06/01/2019   BUN 12 06/01/2019   NA 141 06/01/2019   K 3.8  06/01/2019   CL 104 06/01/2019   CO2 23 06/01/2019   Lab Results  Component Value Date   ALT 18 06/01/2019   AST 16 06/01/2019   ALKPHOS 87 06/01/2019   BILITOT <0.2 06/01/2019   Lab Results  Component Value  Date   HGBA1C 5.3 09/08/2018   Lab Results  Component Value Date   INSULIN 10.0 06/01/2019   INSULIN 18.7 09/08/2018   Lab Results  Component Value Date   TSH 0.640 09/08/2018   Lab Results  Component Value Date   CHOL 199 06/01/2019   HDL 63 06/01/2019   LDLCALC 119 (H) 06/01/2019   TRIG 98 06/01/2019   Lab Results  Component Value Date   WBC 9.1 06/01/2019   HGB 13.3 06/01/2019   HCT 40.4 06/01/2019   MCV 86 06/01/2019   PLT 368 06/01/2019   No results found for: IRON, TIBC, FERRITIN  Attestation Statements:   Reviewed by clinician on day of visit: allergies, medications, problem list, medical history, surgical history, family history, social history, and previous encounter notes.  Migdalia Dk, am acting as Location manager for [employer], DO   I have reviewed the above documentation for accuracy and completeness, and I agree with the above. Jearld Lesch, DO

## 2019-11-26 ENCOUNTER — Other Ambulatory Visit (INDEPENDENT_AMBULATORY_CARE_PROVIDER_SITE_OTHER): Payer: Self-pay

## 2019-11-26 ENCOUNTER — Encounter (INDEPENDENT_AMBULATORY_CARE_PROVIDER_SITE_OTHER): Payer: Self-pay | Admitting: Bariatrics

## 2019-11-26 DIAGNOSIS — E8881 Metabolic syndrome: Secondary | ICD-10-CM

## 2019-11-26 MED ORDER — METFORMIN HCL 500 MG PO TABS: 500.0000 mg | ORAL_TABLET | Freq: Two times a day (BID) | ORAL | 0 refills | Status: DC

## 2019-12-03 ENCOUNTER — Ambulatory Visit (INDEPENDENT_AMBULATORY_CARE_PROVIDER_SITE_OTHER): Payer: BC Managed Care – PPO | Admitting: Family Medicine

## 2019-12-03 ENCOUNTER — Encounter (INDEPENDENT_AMBULATORY_CARE_PROVIDER_SITE_OTHER): Payer: Self-pay | Admitting: Family Medicine

## 2019-12-03 ENCOUNTER — Other Ambulatory Visit: Payer: Self-pay

## 2019-12-03 VITALS — BP 114/73 | HR 77 | Temp 98.5°F | Ht 65.0 in | Wt 210.0 lb

## 2019-12-03 DIAGNOSIS — F3289 Other specified depressive episodes: Secondary | ICD-10-CM | POA: Diagnosis not present

## 2019-12-03 DIAGNOSIS — E8881 Metabolic syndrome: Secondary | ICD-10-CM | POA: Diagnosis not present

## 2019-12-03 DIAGNOSIS — E559 Vitamin D deficiency, unspecified: Secondary | ICD-10-CM | POA: Diagnosis not present

## 2019-12-03 DIAGNOSIS — Z9189 Other specified personal risk factors, not elsewhere classified: Secondary | ICD-10-CM | POA: Diagnosis not present

## 2019-12-03 DIAGNOSIS — Z6835 Body mass index (BMI) 35.0-35.9, adult: Secondary | ICD-10-CM

## 2019-12-03 MED ORDER — VITAMIN D (ERGOCALCIFEROL) 1.25 MG (50000 UNIT) PO CAPS: 50000.0000 [IU] | ORAL_CAPSULE | ORAL | 0 refills | Status: DC

## 2019-12-03 MED ORDER — METFORMIN HCL 500 MG PO TABS: 500.0000 mg | ORAL_TABLET | Freq: Two times a day (BID) | ORAL | 0 refills | Status: DC

## 2019-12-03 MED ORDER — BUPROPION HCL ER (SR) 150 MG PO TB12: 150.0000 mg | ORAL_TABLET | Freq: Two times a day (BID) | ORAL | 0 refills | Status: DC

## 2019-12-07 ENCOUNTER — Encounter (INDEPENDENT_AMBULATORY_CARE_PROVIDER_SITE_OTHER): Payer: Self-pay | Admitting: Family Medicine

## 2019-12-07 NOTE — Progress Notes (Signed)
Chief Complaint:   OBESITY [name] is here to discuss her progress with her obesity treatment plan along with follow-up of her obesity related diagnoses. [name] is on the Category 3 Plan and states she is following her eating plan approximately 45% of the time. [name] states she is do-ing 0 minutes 0 times per week.  Today's visit was #: 25 Starting weight: 232 lbs Starting date: 09/08/2018 Today's weight: 210 lbs Today's date: 12/03/2019 Total lbs lost to date: 22 Total lbs lost since last in-office visit: 0  Interim History: [name] has not had  An office visit since 06/01/2019. She is up 5 lbs. She is struggling to get breakfast in and has been skipping.  Subjective:   1. Insulin resistance [name] has a diagnosis of insulin resistance based on her elevated fasting insulin level >5. She notes she felt bloated and more hungry without metformin, and she would like to restart. She continues to work on diet and exercise to decrease her risk of diabetes.  Lab Results  Component Value Date   INSULIN 10.0 06/01/2019   INSULIN 18.7 09/08/2018   Lab Results  Component Value Date   HGBA1C 5.3 09/08/2018   2. Vitamin D deficiency [name]'s last Vit D level is nearly at goal at 2. She is on prescription Vit D.  3. Other depression [name] feels that bupropion does help with cravings. She noticed a difference when she was off of it due to not being seen in our office.  4. At risk for side effect of medication [name] is at risk for drug side effects due to restart of bupropion.   Assessment/Plan:   1. Insulin resistance [name] will continue to work on weight loss, exercise, and decreasing simple carbohydrates to help decrease the risk of diabetes. We will refill metformin for 1 month. [name] agreed to follow-up with [place] as directed to closely monitor her progress.  - metFORMIN (GLUCOPHAGE) 500 MG tablet; Take 1 tablet (500 mg total) by mouth 2 (two) times daily with a meal.   Dispense: 60 tablet; Refill: 0  2. Vitamin D deficiency Low Vitamin D level contributes to fatigue and are associated with obesity, breast, and colon cancer. We will refill prescription Vitamin D for 1 month. [name] will follow-up for routine testing of Vitamin D, at least 2-3 times per year to avoid over-replacement.  - Vitamin D, Ergocalciferol, (DRISDOL) 1.25 MG (50000 UNIT) CAPS capsule; Take 1 capsule (50,000 Units total) by mouth every 7 (seven) days.  Dispense: 4 capsule; Refill: 0  3. Other depression Behavior modification techniques were discussed today to help [name] deal with her emotional/non-hunger eating behaviors. We will refill bupropion for 1 month. Orders and follow up as documented in patient record.   - buPROPion (WELLBUTRIN SR) 150 MG 12 hr tablet; Take 1 tablet (150 mg total) by mouth 2 (two) times daily.  Dispense: 60 tablet; Refill: 0  4. At risk for side effect of medication [name] was given approximately 15 minutes of drug side effect counseling today.  We discussed side effect possibility and risk versus benefits. [name] agreed to the medication and will contact this office if these side effects are intolerable.  Repetitive spaced learning was employed today to elicit superior memory formation and behavioral change.  5. Class 2 severe obesity with serious comorbidity and body mass index (BMI) of 35.0 to 35.9 in adult, unspecified obesity type (HCC) [name] is currently in the action stage of change. As such, her goal is to continue  with weight loss efforts. She has agreed to the Category 3 Plan and keeping a food journal and adhering to recommended goals of 250-350 calories and 25+ grams of protein at breakfast daily.   Exercise goals: No exercise has been prescribed at this time.  Behavioral modification strategies: increasing lean protein intake, decreasing simple carbohydrates, no skipping meals and planning for success.  [name] has agreed to follow-up with  our clinic in 2 to 3 weeks. She was informed of the importance of frequent follow-up visits to maximize her success with intensive lifestyle modifications for her multiple health conditions.   Objective:   Blood pressure 114/73, pulse 77, temperature 98.5 F (36.9 C), temperature source Oral, height 5\' 5"  (1.651 m), weight 210 lb (95.3 kg), SpO2 99 %. Body mass index is 34.95 kg/m.  General: Cooperative, alert, well developed, in no acute distress. HEENT: Conjunctivae and lids unremarkable. Cardiovascular: Regular rhythm.  Lungs: Normal work of breathing. Neurologic: No focal deficits.   Lab Results  Component Value Date   CREATININE 0.85 06/01/2019   BUN 12 06/01/2019   NA 141 06/01/2019   K 3.8 06/01/2019   CL 104 06/01/2019   CO2 23 06/01/2019   Lab Results  Component Value Date   ALT 18 06/01/2019   AST 16 06/01/2019   ALKPHOS 87 06/01/2019   BILITOT <0.2 06/01/2019   Lab Results  Component Value Date   HGBA1C 5.3 09/08/2018   Lab Results  Component Value Date   INSULIN 10.0 06/01/2019   INSULIN 18.7 09/08/2018   Lab Results  Component Value Date   TSH 0.640 09/08/2018   Lab Results  Component Value Date   CHOL 199 06/01/2019   HDL 63 06/01/2019   LDLCALC 119 (H) 06/01/2019   TRIG 98 06/01/2019   Lab Results  Component Value Date   WBC 9.1 06/01/2019   HGB 13.3 06/01/2019   HCT 40.4 06/01/2019   MCV 86 06/01/2019   PLT 368 06/01/2019   No results found for: IRON, TIBC, FERRITIN  Attestation Statements:   Reviewed by clinician on day of visit: allergies, medications, problem list, medical history, surgical history, family history, social history, and previous encounter notes.   06/03/2019, am acting as Trude Mcburney for Energy manager, FNP-C.  I have reviewed the above documentation for accuracy and completeness, and I agree with the above. -  [place], FNP

## 2019-12-24 ENCOUNTER — Ambulatory Visit (INDEPENDENT_AMBULATORY_CARE_PROVIDER_SITE_OTHER): Payer: BC Managed Care – PPO | Admitting: Family Medicine

## 2019-12-24 ENCOUNTER — Other Ambulatory Visit: Payer: Self-pay

## 2019-12-24 ENCOUNTER — Encounter (INDEPENDENT_AMBULATORY_CARE_PROVIDER_SITE_OTHER): Payer: Self-pay | Admitting: Family Medicine

## 2019-12-24 VITALS — BP 101/70 | HR 75 | Temp 98.2°F | Ht 65.0 in | Wt 210.0 lb

## 2019-12-24 DIAGNOSIS — E8881 Metabolic syndrome: Secondary | ICD-10-CM | POA: Diagnosis not present

## 2019-12-24 DIAGNOSIS — R5383 Other fatigue: Secondary | ICD-10-CM

## 2019-12-24 DIAGNOSIS — E7849 Other hyperlipidemia: Secondary | ICD-10-CM

## 2019-12-24 DIAGNOSIS — E559 Vitamin D deficiency, unspecified: Secondary | ICD-10-CM

## 2019-12-24 DIAGNOSIS — Z6835 Body mass index (BMI) 35.0-35.9, adult: Secondary | ICD-10-CM

## 2019-12-24 DIAGNOSIS — F3289 Other specified depressive episodes: Secondary | ICD-10-CM

## 2019-12-24 MED ORDER — VITAMIN D (ERGOCALCIFEROL) 1.25 MG (50000 UNIT) PO CAPS: 50000.0000 [IU] | ORAL_CAPSULE | ORAL | 0 refills | Status: DC

## 2019-12-24 MED ORDER — METFORMIN HCL 500 MG PO TABS: 500.0000 mg | ORAL_TABLET | Freq: Two times a day (BID) | ORAL | 0 refills | Status: DC

## 2019-12-24 MED ORDER — BUPROPION HCL ER (SR) 150 MG PO TB12: 150.0000 mg | ORAL_TABLET | Freq: Two times a day (BID) | ORAL | 0 refills | Status: DC

## 2019-12-24 NOTE — Progress Notes (Signed)
Chief Complaint:   OBESITY [name] is here to discuss her progress with her obesity treatment plan along with follow-up of her obesity related diagnoses. [name] is on the Category 3 Plan and keeping a food journal and adhering to recommended goals of 250-350 calories and 25+ grams of protein at breakfast daily and states she is following her eating plan approximately 60% of the time. [name] states she is doing 0 minutes 0 times per week.  Today's visit was #: 26 Starting weight: 232 lbs Starting date: 09/08/2018 Today's weight: 210 lbs Today's date: 12/24/2019 Total lbs lost to date: 22 Total lbs lost since last in-office visit: 0  Interim History: [name] denies excessive hunger. She did get off the plan over the recent holiday. She does go over at times on extra calories. She is eating out some in the evening due to her son's baseball games and tends to be off plan.  Subjective:   1. Other hyperlipidemia [name] has hyperlipidemia and has been trying to improve her cholesterol levels with intensive lifestyle modification including a low saturated fat diet, exercise and weight loss. Last LDL was 119 (high), and HDL and triglycerides were within normal limits. She is not on statin.   Lab Results  Component Value Date   ALT 18 06/01/2019   AST 16 06/01/2019   ALKPHOS 87 06/01/2019   BILITOT <0.2 06/01/2019   Lab Results  Component Value Date   CHOL 199 06/01/2019   HDL 63 06/01/2019   LDLCALC 119 (H) 06/01/2019   TRIG 98 06/01/2019   2. Other fatigue [name] notes fatigue recently. She is working overtime. She notes occasional snoring. She gets < 6 hours of sleep some nights. She gets in bed later than 11 pm sometimes and gets up at 6 am. She denies insomnia and feels sleep quality is good.   3. Insulin resistance [name] has a diagnosis of insulin resistance based on her elevated fasting insulin level >5. She denies polyphagia, and she is on metformin daily. She  continues to work on diet and exercise to decrease her risk of diabetes.  Lab Results  Component Value Date   INSULIN 10.0 06/01/2019   INSULIN 18.7 09/08/2018   Lab Results  Component Value Date   HGBA1C 5.3 09/08/2018   4. Vitamin D deficiency [name]'s Vit D level is nearly at goal at 28. She is on prescription Vit D.  5. Other depression, with emotional eating [name] feels bupropion is helping with emotional eating.  Assessment/Plan:   1. Other hyperlipidemia Cardiovascular risk and specific lipid/LDL goals reviewed. We discussed several lifestyle modifications today and [name] will continue to work on diet, exercise and weight loss efforts. We will check labs today.  - Lipid Panel With LDL/HDL Ratio  2. Other fatigue [name] is to move her bedtime to 10 pm and try to get at least 7-8 hours of sleep per night.  3. Insulin resistance [name] will continue to work on weight loss, exercise, and decreasing simple carbohydrates to help decrease the risk of diabetes. We will check labs today, and we will refill metformin for 1 month. [name] agreed to follow-up with [place] as directed to closely monitor her progress.  - Hemoglobin A1c - Insulin, random  - metFORMIN (GLUCOPHAGE) 500 MG tablet; Take 1 tablet (500 mg total) by mouth 2 (two) times daily with a meal.  Dispense: 60 tablet; Refill: 0  4. Vitamin D deficiency Low Vitamin D level contributes to fatigue and are associated with  obesity, breast, and colon cancer. We will check labs today, and we will refill prescription Vitamin D for 1 month. [name] will follow-up for routine testing of Vitamin D, at least 2-3 times per year to avoid over-replacement.  - VITAMIN D 25 Hydroxy (Vit-D Deficiency, Fractures)  - Vitamin D, Ergocalciferol, (DRISDOL) 1.25 MG (50000 UNIT) CAPS capsule; Take 1 capsule (50,000 Units total) by mouth every 7 (seven) days.  Dispense: 4 capsule; Refill: 0  5. Other depression, with emotional  eating Behavior modification techniques were discussed today to help [name] deal with her emotional/non-hunger eating behaviors. We will refill bupropion for 1 month. Orders and follow up as documented in patient record.   - buPROPion (WELLBUTRIN SR) 150 MG 12 hr tablet; Take 1 tablet (150 mg total) by mouth 2 (two) times daily.  Dispense: 60 tablet; Refill: 0  6. Class 2 severe obesity with serious comorbidity and body mass index (BMI) of 35.0 to 35.9 in adult, unspecified obesity type (HCC) [name] is currently in the action stage of change. As such, her goal is to continue with weight loss efforts. She has agreed to the Category 3 Plan and keeping a food journal and adhering to recommended goals of 250-350 calories and 25 grams of protein at breakfast daily, or following a lower carbohydrate, vegetable and lean protein rich diet plan.   Exercise goals: No exercise has been prescribed at this time.  Behavioral modification strategies: increasing lean protein intake, decreasing simple carbohydrates, decreasing eating out and meal planning and cooking strategies.  [name] has agreed to follow-up with our clinic in 3 weeks. She was informed of the importance of frequent follow-up visits to maximize her success with intensive lifestyle modifications for her multiple health conditions.   [name] was informed we would discuss her lab results at her next visit unless there is a critical issue that needs to be addressed sooner. [name] agreed to keep her next visit at the agreed upon time to discuss these results.  Objective:   Blood pressure 101/70, pulse 75, temperature 98.2 F (36.8 C), temperature source Oral, height 5\' 5"  (1.651 m), weight 210 lb (95.3 kg), SpO2 98 %. Body mass index is 34.95 kg/m.  General: Cooperative, alert, well developed, in no acute distress. HEENT: Conjunctivae and lids unremarkable. Cardiovascular: Regular rhythm.  Lungs: Normal work of breathing. Neurologic: No  focal deficits.   Lab Results  Component Value Date   CREATININE 0.85 06/01/2019   BUN 12 06/01/2019   NA 141 06/01/2019   K 3.8 06/01/2019   CL 104 06/01/2019   CO2 23 06/01/2019   Lab Results  Component Value Date   ALT 18 06/01/2019   AST 16 06/01/2019   ALKPHOS 87 06/01/2019   BILITOT <0.2 06/01/2019   Lab Results  Component Value Date   HGBA1C 5.3 09/08/2018   Lab Results  Component Value Date   INSULIN 10.0 06/01/2019   INSULIN 18.7 09/08/2018   Lab Results  Component Value Date   TSH 0.640 09/08/2018   Lab Results  Component Value Date   CHOL 199 06/01/2019   HDL 63 06/01/2019   LDLCALC 119 (H) 06/01/2019   TRIG 98 06/01/2019   Lab Results  Component Value Date   WBC 9.1 06/01/2019   HGB 13.3 06/01/2019   HCT 40.4 06/01/2019   MCV 86 06/01/2019   PLT 368 06/01/2019   No results found for: IRON, TIBC, FERRITIN  Attestation Statements:   Reviewed by clinician on day of visit: allergies, medications,  problem list, medical history, surgical history, family history, social history, and previous encounter notes.   Wilhemena Durie, am acting as Location manager for [employer], FNP-C.  I have reviewed the above documentation for accuracy and completeness, and I agree with the above. -  Georgianne Fick, FNP

## 2019-12-25 LAB — LIPID PANEL WITH LDL/HDL RATIO
Cholesterol, Total: 203 mg/dL — ABNORMAL HIGH (ref 100–199)
HDL: 70 mg/dL (ref >39)
LDL Chol Calc (NIH): 115 mg/dL — ABNORMAL HIGH (ref 0–99)
LDL/HDL Ratio: 1.6 ratio (ref 0.0–3.2)
Triglycerides: 104 mg/dL (ref 0–149)
VLDL Cholesterol Cal: 18 mg/dL (ref 5–40)

## 2019-12-25 LAB — INSULIN, RANDOM: INSULIN: 11.2 u[IU]/mL (ref 2.6–24.9)

## 2019-12-25 LAB — HEMOGLOBIN A1C
Est. average glucose Bld gHb Est-mCnc: 105 mg/dL
Hgb A1c MFr Bld: 5.3 % (ref 4.8–5.6)

## 2019-12-25 LAB — VITAMIN D 25 HYDROXY (VIT D DEFICIENCY, FRACTURES): Vit D, 25-Hydroxy: 42.1 ng/mL (ref 30.0–100.0)

## 2020-01-14 ENCOUNTER — Ambulatory Visit (INDEPENDENT_AMBULATORY_CARE_PROVIDER_SITE_OTHER): Payer: BC Managed Care – PPO | Admitting: Family Medicine

## 2020-01-28 ENCOUNTER — Ambulatory Visit (INDEPENDENT_AMBULATORY_CARE_PROVIDER_SITE_OTHER): Payer: BC Managed Care – PPO | Admitting: Family Medicine

## 2020-01-28 ENCOUNTER — Other Ambulatory Visit: Payer: Self-pay

## 2020-01-28 ENCOUNTER — Encounter (INDEPENDENT_AMBULATORY_CARE_PROVIDER_SITE_OTHER): Payer: Self-pay | Admitting: Family Medicine

## 2020-01-28 VITALS — BP 117/77 | HR 82 | Temp 98.6°F | Ht 65.0 in | Wt 211.0 lb

## 2020-01-28 DIAGNOSIS — Z9189 Other specified personal risk factors, not elsewhere classified: Secondary | ICD-10-CM

## 2020-01-28 DIAGNOSIS — E8881 Metabolic syndrome: Secondary | ICD-10-CM | POA: Diagnosis not present

## 2020-01-28 DIAGNOSIS — F3289 Other specified depressive episodes: Secondary | ICD-10-CM

## 2020-01-28 DIAGNOSIS — E559 Vitamin D deficiency, unspecified: Secondary | ICD-10-CM

## 2020-01-28 DIAGNOSIS — Z6835 Body mass index (BMI) 35.0-35.9, adult: Secondary | ICD-10-CM

## 2020-01-28 MED ORDER — METFORMIN HCL 500 MG PO TABS: 500.0000 mg | ORAL_TABLET | Freq: Two times a day (BID) | ORAL | 0 refills | Status: DC

## 2020-01-28 MED ORDER — VITAMIN D (ERGOCALCIFEROL) 1.25 MG (50000 UNIT) PO CAPS: 50000.0000 [IU] | ORAL_CAPSULE | ORAL | 0 refills | Status: DC

## 2020-01-28 MED ORDER — BUPROPION HCL ER (SR) 150 MG PO TB12: 150.0000 mg | ORAL_TABLET | Freq: Two times a day (BID) | ORAL | 0 refills | Status: DC

## 2020-02-03 ENCOUNTER — Encounter (INDEPENDENT_AMBULATORY_CARE_PROVIDER_SITE_OTHER): Payer: Self-pay | Admitting: Family Medicine

## 2020-02-03 NOTE — Progress Notes (Signed)
Chief Complaint:   OBESITY [name] is here to discuss her progress with her obesity treatment plan along with follow-up of her obesity related diagnoses. [name] is on the Category 3 Plan, keeping a food journal and adhering to recommended goals of 250-350 calories and 25 grams of protein at breakfast daily or following a lower carbohydrate, vegetable and lean protein rich diet plan atand states she is following her eating plan approximately 30% of the time. [name] states she is doing 0 minutes 0 times per week.  Today's visit was #: 27 Starting weight: 232 lbs Starting date: 09/08/2018 Today's weight: 211 lbs Today's date: 01/28/2020 Total lbs lost to date: 21 Total lbs lost since last in-office visit: 0  Interim History: [name] was off the plan totally when she was on vacation last week. She is back on the plan.  Subjective:   1. Vitamin D deficiency Spirit has been inconsistent with taking prescription Vit D. Last Vit D level was slightly low at 42.  2. Insulin resistance [name] has a diagnosis of insulin resistance based on her elevated fasting insulin level >5. [name] is on metformin, and she denies polyphagia. She continues to work on diet and exercise to decrease her risk of diabetes.  Lab Results  Component Value Date   INSULIN 11.2 12/24/2019   INSULIN 10.0 06/01/2019   INSULIN 18.7 09/08/2018   Lab Results  Component Value Date   HGBA1C 5.3 12/24/2019   3. Other depression with emotional eating  [name]'s cravings are better controlled with bupropion. Her mood is stable.  4. At risk for osteoporosis [name] is at higher risk of osteopenia and osteoporosis due to Vitamin D deficiency.   Assessment/Plan:   1. Vitamin D deficiency Low Vitamin D level contributes to fatigue and are associated with obesity, breast, and colon cancer. We will refill prescription Vitamin D for 1 month. [name] will follow-up for routine testing of Vitamin D, at least 2-3 times  per year to avoid over-replacement.  - Vitamin D, Ergocalciferol, (DRISDOL) 1.25 MG (50000 UNIT) CAPS capsule; Take 1 capsule (50,000 Units total) by mouth every 7 (seven) days.  Dispense: 4 capsule; Refill: 0  2. Insulin resistance Ramla will continue to work on weight loss, exercise, and decreasing simple carbohydrates to help decrease the risk of diabetes. We will refill metformin for 1 month. [name] agreed to follow-up with [place] as directed to closely monitor her progress.  - metFORMIN (GLUCOPHAGE) 500 MG tablet; Take 1 tablet (500 mg total) by mouth 2 (two) times daily with a meal.  Dispense: 60 tablet; Refill: 0  3. Other depression with emotional eating  Behavior modification techniques were discussed today to help [name] deal with her emotional/non-hunger eating behaviors. We will refill bupropion for 1 month. Orders and follow up as documented in patient record.   - buPROPion (WELLBUTRIN SR) 150 MG 12 hr tablet; Take 1 tablet (150 mg total) by mouth 2 (two) times daily.  Dispense: 60 tablet; Refill: 0  4. At risk for osteoporosis [name] was given approximately 15 minutes of osteoporosis prevention counseling today. [name] is at risk for osteopenia and osteoporosis due to her Vitamin D deficiency. She was encouraged to take her Vitamin D and follow her higher calcium diet and increase strengthening exercise to help strengthen her bones and decrease her risk of osteopenia and osteoporosis.  Repetitive spaced learning was employed today to elicit superior memory formation and behavioral change.  5. Class 2 severe obesity with serious comorbidity and body  mass index (BMI) of 35.0 to 35.9 in adult, unspecified obesity type (HCC) [name] is currently in the action stage of change. As such, her goal is to continue with weight loss efforts. She has agreed to the Category 3 Plan.   Exercise goals: All adults should avoid inactivity. Some physical activity is better than none, and adults who  participate in any amount of physical activity gain some health benefits.  Behavioral modification strategies: increasing lean protein intake and decreasing simple carbohydrates.  [name] has agreed to follow-up with our clinic in 3 weeks. She was informed of the importance of frequent follow-up visits to maximize her success with intensive lifestyle modifications for her multiple health conditions.   Objective:   Blood pressure 117/77, pulse 82, temperature 98.6 F (37 C), temperature source Oral, height 5\' 5"  (1.651 m), weight 211 lb (95.7 kg), SpO2 99 %. Body mass index is 35.11 kg/m.  General: Cooperative, alert, well developed, in no acute distress. HEENT: Conjunctivae and lids unremarkable. Cardiovascular: Regular rhythm.  Lungs: Normal work of breathing. Neurologic: No focal deficits.   Lab Results  Component Value Date   CREATININE 0.85 06/01/2019   BUN 12 06/01/2019   NA 141 06/01/2019   K 3.8 06/01/2019   CL 104 06/01/2019   CO2 23 06/01/2019   Lab Results  Component Value Date   ALT 18 06/01/2019   AST 16 06/01/2019   ALKPHOS 87 06/01/2019   BILITOT <0.2 06/01/2019   Lab Results  Component Value Date   HGBA1C 5.3 12/24/2019   HGBA1C 5.3 09/08/2018   Lab Results  Component Value Date   INSULIN 11.2 12/24/2019   INSULIN 10.0 06/01/2019   INSULIN 18.7 09/08/2018   Lab Results  Component Value Date   TSH 0.640 09/08/2018   Lab Results  Component Value Date   CHOL 203 (H) 12/24/2019   HDL 70 12/24/2019   LDLCALC 115 (H) 12/24/2019   TRIG 104 12/24/2019   Lab Results  Component Value Date   WBC 9.1 06/01/2019   HGB 13.3 06/01/2019   HCT 40.4 06/01/2019   MCV 86 06/01/2019   PLT 368 06/01/2019   No results found for: IRON, TIBC, FERRITIN  Attestation Statements:   Reviewed by clinician on day of visit: allergies, medications, problem list, medical history, surgical history, family history, social history, and previous encounter notes.   06/03/2019, am acting as Trude Mcburney for Energy manager, FNP-C.  I have reviewed the above documentation for accuracy and completeness, and I agree with the above. -  [place], FNP

## 2020-02-29 ENCOUNTER — Ambulatory Visit (INDEPENDENT_AMBULATORY_CARE_PROVIDER_SITE_OTHER): Payer: BC Managed Care – PPO | Admitting: Family Medicine

## 2020-03-16 IMAGING — MG MM DIGITAL DIAGNOSTIC UNILAT*R* W/ TOMO W/ CAD
4 series · 4 of 12 positions shown · non-contrast
Comparison: Previous exam(s).

CLINICAL DATA: Patient recalled from screening for right breast
mass.

EXAM:
DIGITAL DIAGNOSTIC RIGHT MAMMOGRAM WITH CAD AND TOMO
ULTRASOUND RIGHT BREAST

[R CC synth-2D]
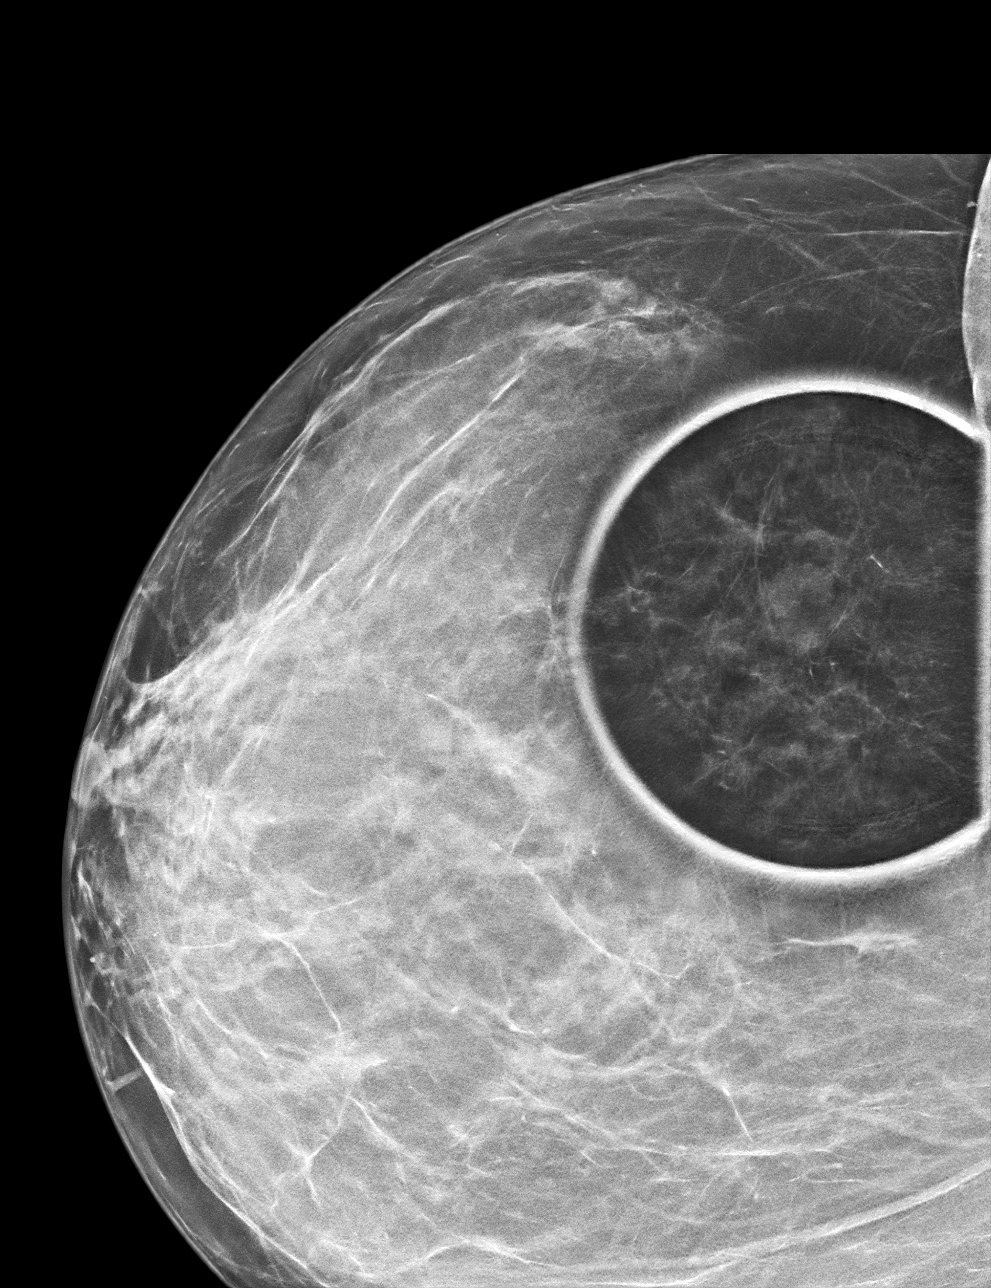

[R MLO synth-2D]
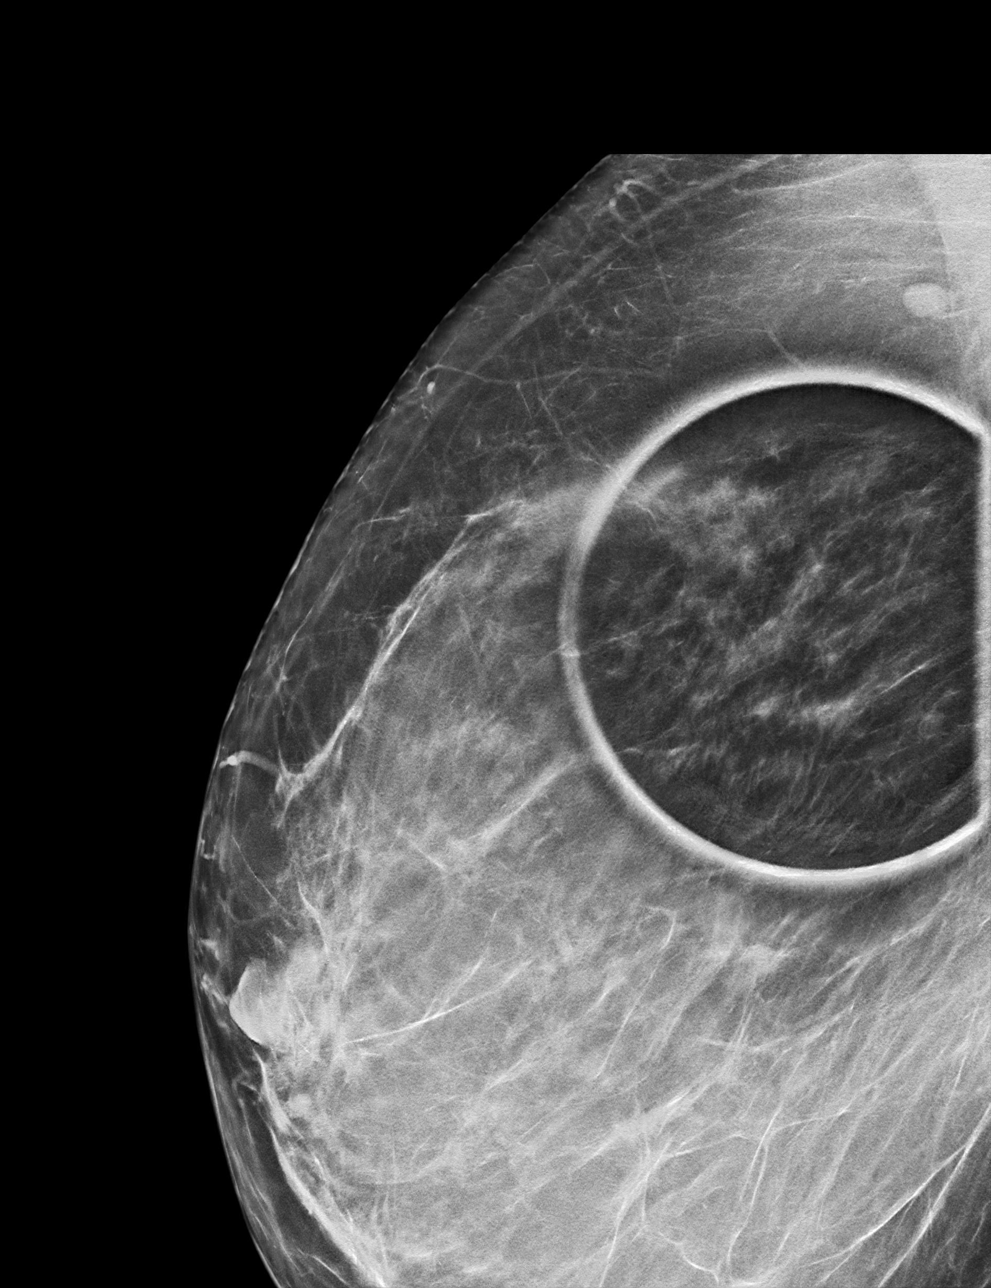

[R MLO tomo · tomo slice 37/72.0]
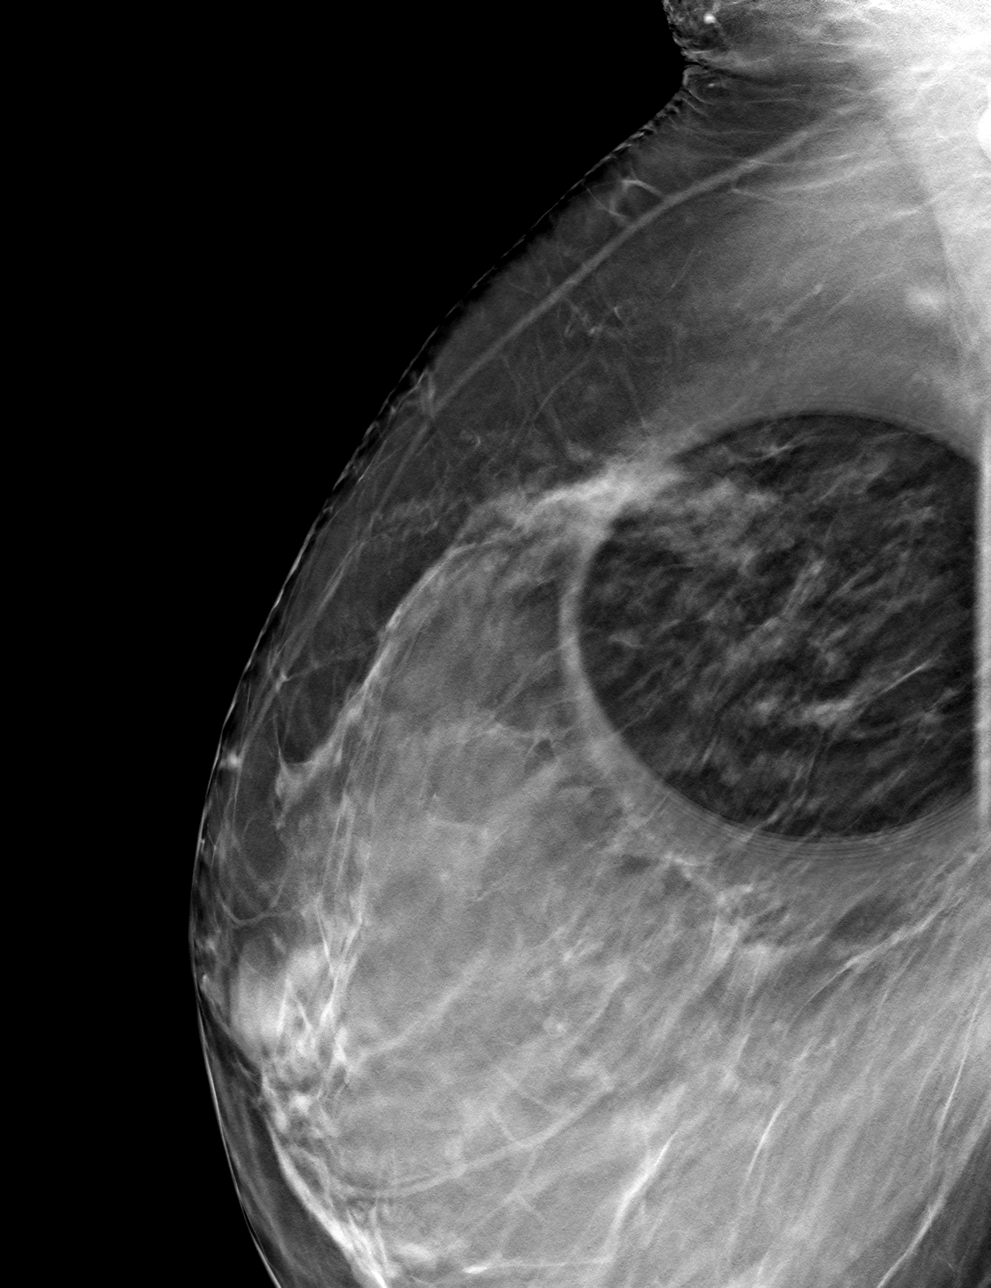

[R CC tomo · tomo slice 31/61.0]
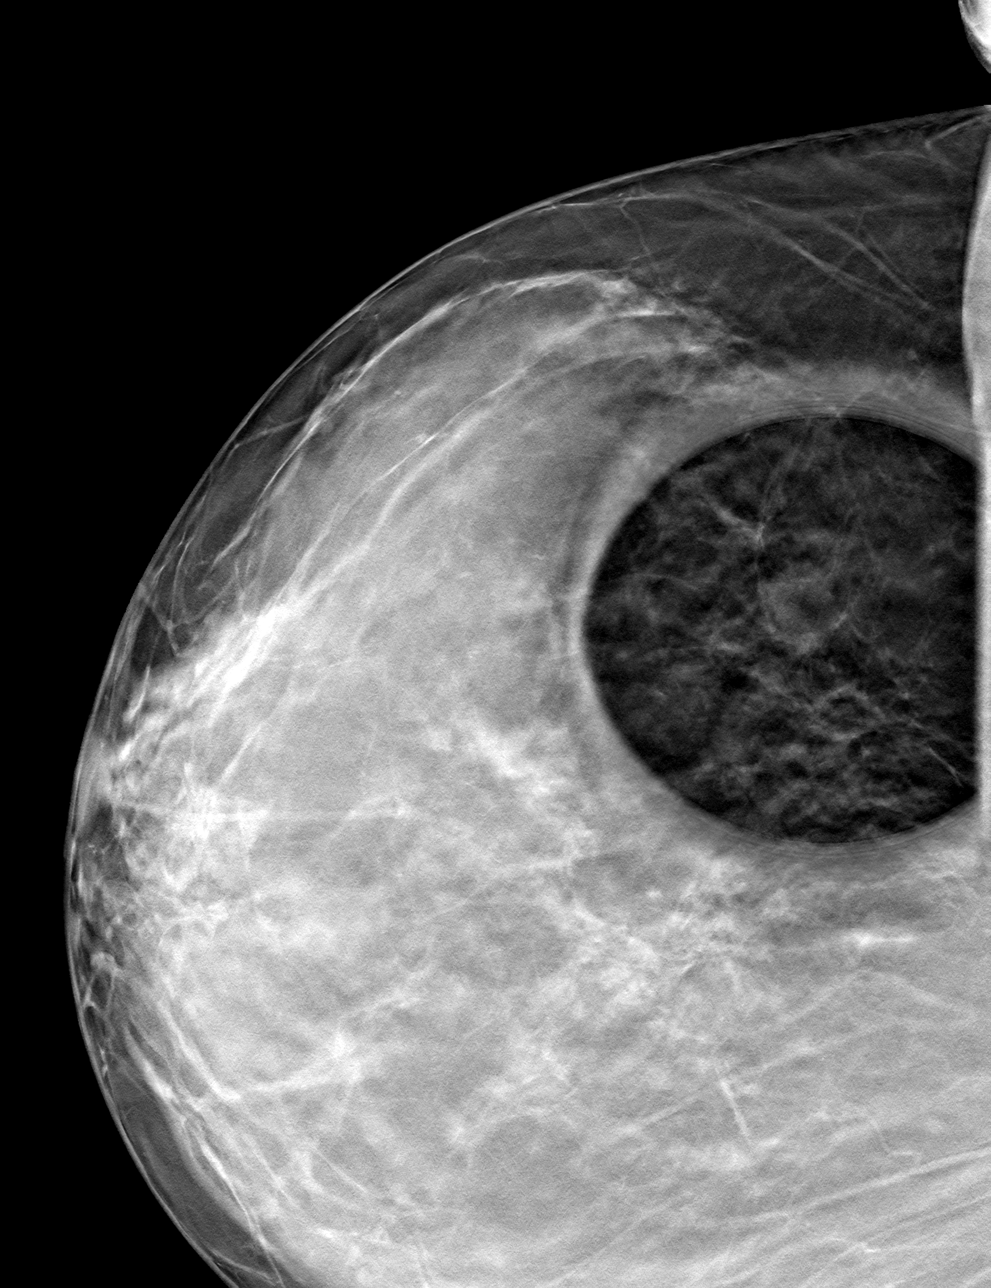

[4 of 12 positions shown; findings below may reference images not displayed]

ACR Breast Density Category c: The breast tissue is heterogeneously
dense, which may obscure small masses.
FINDINGS: There is an obscured oval mass within the upper-outer right breast
posterior depth further evaluated with spot compression cc and MLO
tomosynthesis images.

Mammographic images were processed with CAD.

Targeted ultrasound is performed, showing a 1.2 x 0.6 x 1.0 cm cyst
right breast 10 o'clock position 7 cm from the nipple.
IMPRESSION: Right breast simple cyst.

No evidence for malignancy.

RECOMMENDATION:
Screening mammogram in one year.(Code:VO-1-TXE)

I have discussed the findings and recommendations with the patient.
If applicable, a reminder letter will be sent to the patient
regarding the next appointment.

BI-RADS CATEGORY  2: Benign.

## 2022-02-12 DIAGNOSIS — G8929 Other chronic pain: Secondary | ICD-10-CM | POA: Insufficient documentation

## 2022-02-21 ENCOUNTER — Encounter (INDEPENDENT_AMBULATORY_CARE_PROVIDER_SITE_OTHER): Payer: Self-pay

## 2022-11-22 ENCOUNTER — Ambulatory Visit: Payer: BC Managed Care – PPO | Admitting: Urology

## 2022-11-22 ENCOUNTER — Encounter: Payer: Self-pay | Admitting: Urology

## 2022-11-22 VITALS — BP 110/78 | HR 85 | Ht 65.0 in | Wt 181.6 lb

## 2022-11-22 DIAGNOSIS — Z87440 Personal history of urinary (tract) infections: Secondary | ICD-10-CM

## 2022-11-22 DIAGNOSIS — N390 Urinary tract infection, site not specified: Secondary | ICD-10-CM

## 2022-11-22 LAB — URINALYSIS, COMPLETE
Bilirubin, UA: NEGATIVE
Glucose, UA: NEGATIVE
Ketones, UA: NEGATIVE
Leukocytes,UA: NEGATIVE
Nitrite, UA: NEGATIVE
Protein,UA: NEGATIVE
Specific Gravity, UA: 1.02 (ref 1.005–1.030)
Urobilinogen, Ur: 0.2 mg/dL (ref 0.2–1.0)
pH, UA: 6 (ref 5.0–7.5)

## 2022-11-22 LAB — MICROSCOPIC EXAMINATION: Epithelial Cells (non renal): >10 /hpf — AB (ref 0–10)

## 2022-11-22 NOTE — Progress Notes (Signed)
[name],acting as a Neurosurgeon for [name].,have documented all relevant documentation on the behalf of [name],as directed by  [name] while in the presence of [name].  11/22/2022 8:52 AM   [name] 1976-04-08 [id number]  Referring provider: Haywood Lasso [address] [place],  [place] [address]  Chief Complaint  Patient presents with   Recurrent UTI    HPI: [name] is a 47 y.o. female who is referred  for recurrent UTI's.  1-2 year history of recurrent urinary tract infections Typical symptoms are urgency and malodorous urine, which resolve with antibiotic therapy Symptom onset typically related to within 24 hours after intercourse She is presently on Keflex suppression, which she takes after intercourse and has been infection free for the last 2 months History of febrile UTI in high school but no recurrent episodes No bothersome LUTS Denies dysuria, gross hematuria Denies flank, abdominal or pelvic pain  PMH: Past Medical History:  Diagnosis Date   Back pain    Joint pain    Kidney problem    Swallowing difficulty     Surgical History: Past Surgical History:  Procedure Laterality Date   CESAREAN SECTION     x2    Home Medications:  Allergies as of 11/22/2022       Reactions   Nitrofuran Derivatives Rash   And itching, tried this medication 3 times and had same reaction   Sulfa Antibiotics Rash        Medication List        Accurate as of Nov 22, 2022  8:52 AM. If you have any questions, ask your nurse or doctor.          STOP taking these medications    Aviane 0.1-20 MG-MCG tablet Generic drug: levonorgestrel-ethinyl estradiol Stopped by: [name]   buPROPion 150 MG 12 hr tablet Commonly known as: Wellbutrin SR Stopped by: [name]   Cranberry 1000 MG Caps Stopped by: [name]   ibuprofen 100 MG tablet Commonly known  as: ADVIL Stopped by: [name]   Krill Oil 1000 MG Caps Stopped by: [name]   metFORMIN 500 MG tablet Commonly known as: GLUCOPHAGE Stopped by: [name]   PROBIOTIC-10 PO Stopped by: [name]   Vitamin D (Ergocalciferol) 1.25 MG (50000 UNIT) Caps capsule Commonly known as: DRISDOL Stopped by: [name]   ZyrTEC Allergy 10 MG Caps Generic drug: Cetirizine HCl Stopped by: [name]       TAKE these medications    cephALEXin 250 MG capsule Commonly known as: KEFLEX Take 250 mg by mouth at bedtime.   Ozempic (2 MG/DOSE) 8 MG/3ML Sopn Generic drug: Semaglutide (2 MG/DOSE) Inject 2 mg into the skin once a week.        Allergies:  Allergies  Allergen Reactions   Nitrofuran Derivatives Rash    And itching, tried this medication 3 times and had same reaction   Sulfa Antibiotics Rash     Social History:  reports that she has never smoked. She has never used smokeless tobacco. She reports that she does not currently use alcohol. She reports that she does not use drugs.   Physical Exam: BP 110/78   Pulse 85   Ht 5\' 5"  (1.651 m)   Wt 181 lb 9 oz (82.4 kg)  BMI 30.21 kg/m   Constitutional:  Alert and oriented, No acute distress. HEENT: [place] AT Respiratory: Normal respiratory effort, no increased work of breathing. Psychiatric: Normal mood and affect.  Laboratory Data:  Urinalysis  Dipstick: trace blood, Microscopy: 3-10 RBC/ >10 epithelial cells/ moderate bacteria   Assessment & Plan:    1. Recurrent UTI's There were no forwarding records, so no culture documentation Doing well on low dose post-coital prophylaxis with Keflex provided by her PCP [facility]A today with 3-10 RBC's, however significant epithelial cells indicating vaginal contamination Follow up PRN  I have reviewed the above documentation for accuracy and completeness, and I agree with the above.   [name]  [facility], [place]  [facility] [address], [address] [place], [place] [address] [phone]

## 2022-12-04 ENCOUNTER — Other Ambulatory Visit: Payer: Self-pay | Admitting: *Deleted

## 2022-12-04 ENCOUNTER — Encounter: Payer: Self-pay | Admitting: Urology

## 2022-12-05 MED ORDER — CEPHALEXIN 250 MG PO CAPS: 250.0000 mg | ORAL_CAPSULE | ORAL | 0 refills | Status: DC | PRN

## 2023-01-11 ENCOUNTER — Telehealth: Payer: Self-pay

## 2023-01-11 NOTE — Telephone Encounter (Signed)
I believe this message was put in the wrong patient chart.  This is not our patient and it is not the patient [facility] had talked to me about   Copied from CRM [id number]. Topic: General - Other >> Jan 11, 2023  8:52 AM Dondra Prader A wrote: Reason for CRM: Revonda Standard with [facility] is calling to speak with [facility]. Per Revonda Standard they have been audited and she spoke with Michelle Nasuti last week about getting the paperwork sent to their audit department. The paperwork that was sent was the wrong paperwork and she is needing to speak with with [facility]. Please call Revonda Standard back.

## 2023-04-03 ENCOUNTER — Other Ambulatory Visit: Payer: Self-pay | Admitting: Urology

## 2023-04-05 ENCOUNTER — Other Ambulatory Visit: Payer: Self-pay | Admitting: Urology

## 2023-04-06 MED ORDER — CEPHALEXIN 250 MG PO CAPS: ORAL_CAPSULE | ORAL | 2 refills | Status: AC

## 2023-06-20 ENCOUNTER — Other Ambulatory Visit: Payer: Self-pay | Admitting: Infectious Diseases

## 2023-06-20 DIAGNOSIS — Z1231 Encounter for screening mammogram for malignant neoplasm of breast: Secondary | ICD-10-CM

## 2023-09-12 DIAGNOSIS — Z8639 Personal history of other endocrine, nutritional and metabolic disease: Secondary | ICD-10-CM | POA: Insufficient documentation

## 2023-09-12 DIAGNOSIS — E538 Deficiency of other specified B group vitamins: Secondary | ICD-10-CM | POA: Insufficient documentation

## 2023-09-12 DIAGNOSIS — Z6827 Body mass index (BMI) 27.0-27.9, adult: Secondary | ICD-10-CM | POA: Insufficient documentation

## 2023-10-03 ENCOUNTER — Other Ambulatory Visit: Payer: Self-pay | Admitting: Family Medicine

## 2023-10-03 ENCOUNTER — Ambulatory Visit
Admission: RE | Admit: 2023-10-03 | Discharge: 2023-10-03 | Disposition: A | Discharge status: Home / Self Care | Source: Ambulatory Visit | Attending: Family Medicine | Admitting: Family Medicine

## 2023-10-03 DIAGNOSIS — R10A0 Flank pain, unspecified side: Secondary | ICD-10-CM

## 2023-10-03 DIAGNOSIS — R310 Gross hematuria: Secondary | ICD-10-CM

## 2023-10-03 DIAGNOSIS — R109 Unspecified abdominal pain: Secondary | ICD-10-CM | POA: Insufficient documentation

## 2023-10-07 ENCOUNTER — Other Ambulatory Visit: Payer: Self-pay | Admitting: Family Medicine

## 2023-10-07 DIAGNOSIS — R319 Hematuria, unspecified: Secondary | ICD-10-CM

## 2023-10-07 DIAGNOSIS — R1031 Right lower quadrant pain: Secondary | ICD-10-CM

## 2023-10-07 DIAGNOSIS — R1032 Left lower quadrant pain: Secondary | ICD-10-CM

## 2023-10-17 ENCOUNTER — Other Ambulatory Visit: Payer: Self-pay | Admitting: Family Medicine

## 2023-10-17 DIAGNOSIS — S22080G Wedge compression fracture of T11-T12 vertebra, subsequent encounter for fracture with delayed healing: Secondary | ICD-10-CM

## 2023-12-02 ENCOUNTER — Encounter: Payer: Self-pay | Admitting: Family Medicine

## 2023-12-11 ENCOUNTER — Ambulatory Visit
Admission: RE | Admit: 2023-12-11 | Discharge: 2023-12-11 | Disposition: A | Discharge status: Home / Self Care | Source: Ambulatory Visit | Attending: Family Medicine | Admitting: Family Medicine

## 2023-12-11 DIAGNOSIS — S22080G Wedge compression fracture of T11-T12 vertebra, subsequent encounter for fracture with delayed healing: Secondary | ICD-10-CM

## 2023-12-13 ENCOUNTER — Encounter: Payer: Self-pay | Admitting: Radiology

## 2023-12-13 ENCOUNTER — Ambulatory Visit
Admission: RE | Admit: 2023-12-13 | Discharge: 2023-12-13 | Disposition: A | Discharge status: Home / Self Care | Source: Ambulatory Visit | Attending: Infectious Diseases | Admitting: Infectious Diseases

## 2023-12-13 DIAGNOSIS — Z1231 Encounter for screening mammogram for malignant neoplasm of breast: Secondary | ICD-10-CM | POA: Diagnosis present

## 2024-03-04 ENCOUNTER — Ambulatory Visit: Payer: Self-pay

## 2024-03-04 DIAGNOSIS — Z1211 Encounter for screening for malignant neoplasm of colon: Secondary | ICD-10-CM | POA: Diagnosis present
